# Patient Record
Sex: Male | Born: 1965
Health system: Southern US, Community
[De-identification: ages and names within clinical notes are randomized; demographics above are authoritative.]

## PROBLEM LIST (undated history)

## (undated) DIAGNOSIS — I10 Essential (primary) hypertension: Secondary | ICD-10-CM

## (undated) DIAGNOSIS — M5136 Other intervertebral disc degeneration, lumbar region: Secondary | ICD-10-CM

## (undated) DIAGNOSIS — B009 Herpesviral infection, unspecified: Secondary | ICD-10-CM

## (undated) DIAGNOSIS — M51369 Other intervertebral disc degeneration, lumbar region without mention of lumbar back pain or lower extremity pain: Secondary | ICD-10-CM

## (undated) HISTORY — PX: VASECTOMY: SHX75

## (undated) HISTORY — DX: Herpesviral infection, unspecified: B00.9

## (undated) HISTORY — DX: Other intervertebral disc degeneration, lumbar region: M51.36

## (undated) HISTORY — PX: NECK SURGERY: SHX720

## (undated) HISTORY — DX: Essential (primary) hypertension: I10

## (undated) HISTORY — DX: Other intervertebral disc degeneration, lumbar region without mention of lumbar back pain or lower extremity pain: M51.369

---

## 2012-01-14 ENCOUNTER — Ambulatory Visit (INDEPENDENT_AMBULATORY_CARE_PROVIDER_SITE_OTHER): Payer: BC Managed Care – PPO | Admitting: Family Medicine

## 2012-01-14 ENCOUNTER — Ambulatory Visit: Payer: BC Managed Care – PPO

## 2012-01-14 VITALS — BP 126/74 | HR 87 | Temp 99.2°F | Resp 17 | Ht 71.5 in | Wt 187.0 lb

## 2012-01-14 DIAGNOSIS — J209 Acute bronchitis, unspecified: Secondary | ICD-10-CM

## 2012-01-14 DIAGNOSIS — R0989 Other specified symptoms and signs involving the circulatory and respiratory systems: Secondary | ICD-10-CM

## 2012-01-14 DIAGNOSIS — R05 Cough: Secondary | ICD-10-CM

## 2012-01-14 DIAGNOSIS — R059 Cough, unspecified: Secondary | ICD-10-CM

## 2012-01-14 MED ORDER — ALBUTEROL SULFATE HFA 108 (90 BASE) MCG/ACT IN AERS: 2.0000 | INHALATION_SPRAY | Freq: Four times a day (QID) | RESPIRATORY_TRACT | Status: DC | PRN

## 2012-01-14 MED ORDER — BENZONATATE 100 MG PO CAPS: 200.0000 mg | ORAL_CAPSULE | Freq: Two times a day (BID) | ORAL | Status: AC | PRN

## 2012-01-14 MED ORDER — AZITHROMYCIN 250 MG PO TABS: ORAL_TABLET | ORAL | Status: DC

## 2012-01-14 MED ORDER — HYDROCODONE-HOMATROPINE 5-1.5 MG/5ML PO SYRP: 5.0000 mL | ORAL_SOLUTION | Freq: Every evening | ORAL | Status: DC | PRN

## 2012-01-14 NOTE — Progress Notes (Signed)
[facility]:  Office Visit  Chief Complaint:  Chief Complaint  Patient presents with  . Cough    9 days   . Nasal Congestion    9 days   . Headache    9 days     HPI: Timothy Calderon is a 46 y.o. male who complains of  9 day history of deep  Dry coughing, fever Tmax 103, chills, msk aches and pain, took otc dayquil, nyquil and mucinex. + chest congestion, no rhinorrhea, no ear pain, + HA for cough. Just moved from [place], [place]. + smoker socially  History reviewed. No pertinent past medical history. Past Surgical History  Procedure Date  . Vasectomy    History   Social History  . Marital Status: Married    Spouse Name: N/A    Number of Children: N/A  . Years of Education: N/A   Social History Main Topics  . Smoking status: Current Some Day Smoker -- 0.1 packs/day for 24 years    Types: Cigarettes  . Smokeless tobacco: None  . Alcohol Use: 1.2 oz/week    2 Cans of beer per week  . Drug Use: No  . Sexually Active: No   Other Topics Concern  . None   Social History Narrative  . None   Family History  Problem Relation Age of Onset  . Heart disease Father    Allergies  Allergen Reactions  . Penicillins Other (See Comments)    Was told when he was a kid he was allergic   . Sulfur Hives   Prior to Admission medications   Medication Sig Start Date End Date Taking? Authorizing Provider  pseudoephedrine-guaifenesin (MUCINEX D) 60-600 MG per tablet Take 1 tablet by mouth every 12 (twelve) hours.   Yes Historical Provider, MD     ROS: The patient denies  night sweats, unintentional weight loss, chest pain, palpitations, wheezing, dyspnea on exertion, nausea, vomiting, abdominal pain, dysuria, hematuria, melena, numbness, weakness, or tingling.  All other systems have been reviewed and were otherwise negative with the exception of those mentioned in the HPI and as above.    PHYSICAL EXAM: Filed Vitals:   01/14/12 1432  BP: 126/74  Pulse:  87  Temp: 99.2 F (37.3 C)  Resp: 17   Filed Vitals:   01/14/12 1432  Height: 5' 11.5" (1.816 m)  Weight: 187 lb (84.823 kg)   Body mass index is 25.72 kg/(m^2).  General: Alert, no acute distress HEENT:  Normocephalic, atraumatic, oropharynx patent. Tm nl, no sinus tenderness,  No exudates, + erythematous throat Cardiovascular:  Regular rate and rhythm, no rubs murmurs or gallops.  No Carotid bruits, radial pulse intact. No pedal edema.  Respiratory: Clear to auscultation bilaterally.  No wheezes, rales, or rhonchi.  No cyanosis, no use of accessory musculature GI: No organomegaly, abdomen is soft and non-tender, positive bowel sounds.  No masses. Skin: No rashes. Neurologic: Facial musculature symmetric. Psychiatric: Patient is appropriate throughout our interaction. Lymphatic: No cervical lymphadenopathy Musculoskeletal: Gait intact.   LABS: No results found for this or any previous visit.   EKG/XRAY:   Primary read interpreted by Dr. Conley Rolls at [facility]. Bronchitic changes No pneumothorax, no infiltrates   ASSESSMENT/PLAN: Encounter Diagnoses  Name Primary?  . Cough Yes  . Chest congestion   . Acute bronchitis    Most likely viral, however paitent insists on abx Rx Z pack, Tessalon Perles, Hydromet syrup, albuterol inh Asked pt to try sxs treatment first and then  take abx if no improvement F/u prn    Nandita Mathenia PHUONG, DO 01/14/2012 4:00 PM

## 2013-05-21 ENCOUNTER — Ambulatory Visit (INDEPENDENT_AMBULATORY_CARE_PROVIDER_SITE_OTHER): Payer: BC Managed Care – PPO | Admitting: Physician Assistant

## 2013-05-21 VITALS — BP 140/80 | HR 76 | Temp 98.6°F | Resp 16 | Ht 73.0 in | Wt 198.0 lb

## 2013-05-21 DIAGNOSIS — B009 Herpesviral infection, unspecified: Secondary | ICD-10-CM

## 2013-05-21 MED ORDER — VALACYCLOVIR HCL 500 MG PO TABS: 500.0000 mg | ORAL_TABLET | Freq: Two times a day (BID) | ORAL | Status: DC

## 2013-05-21 NOTE — Progress Notes (Signed)
Subjective:    Patient ID: Timothy Calderon, male    DOB: 19-Jan-1966, 48 y.o.   MRN: [id number]  HPI Primary Physician: No primary provider on file.  Chief Complaint: Medication refill   HPI: 48 y.o. male with history of genital HSV presents for medication refill of Valtrex 500 mg bid. Takes medication prn outbreaks. Was diagnosed with HSV in his late 20's, but notes becoming symptomatic around age 68. Rarely gets outbreak. Does not take suppressive therapy. Notes for the past 1 day has had a stinging sensation around the bilateral inner thighs. This is typical for him. No urinary symptoms.    Past Medical History  Diagnosis Date  . HSV (herpes simplex virus) infection      Home Meds: Prior to Admission medications   Medication Sig Start Date End Date Taking? Authorizing Provider                                Allergies:  Allergies  Allergen Reactions  . Penicillins Other (See Comments)    Was told when he was a kid he was allergic   . Sulfur Hives    History   Social History  . Marital Status: Married    Spouse Name: N/A    Number of Children: N/A  . Years of Education: N/A   Occupational History  . Not on file.   Social History Main Topics  . Smoking status: Current Some Day Smoker -- 0.10 packs/day for 24 years    Types: Cigarettes  . Smokeless tobacco: Not on file  . Alcohol Use: 1.2 oz/week    2 Cans of beer per week  . Drug Use: No  . Sexual Activity: No   Other Topics Concern  . Not on file   Social History Narrative  . No narrative on file     Review of Systems  Constitutional: Negative for fever, chills and fatigue.  Genitourinary: Negative for dysuria, urgency, frequency, penile swelling, scrotal swelling, genital sores, penile pain and testicular pain.       Objective:   Physical Exam  Physical Exam: Blood pressure 140/80, pulse 76, temperature 98.6 F (37 C), temperature source Oral, resp. rate 16, height 6\' 1"  (1.854 m), weight 198  lb (89.812 kg), SpO2 96.00%., Body mass index is 26.13 kg/(m^2). General: Well developed, well nourished, in no acute distress. Head: Normocephalic, atraumatic, eyes without discharge, sclera non-icteric, nares are without discharge. Bilateral auditory canals clear, TM's are without perforation, pearly grey and translucent with reflective cone of light bilaterally. Oral cavity moist, posterior pharynx without exudate, erythema, peritonsillar abscess, or post nasal drip. Uvula midline.   Neck: Supple. No thyromegaly. Full ROM. No lymphadenopathy. Lungs: Clear bilaterally to auscultation without wheezes, rales, or rhonchi. Breathing is unlabored. Heart: RRR with S1 S2. No murmurs, rubs, or gallops appreciated. Genitourinary: Circumcised penis. No hernias. Testes smooth and without TTP. No lesions.  Msk:  Strength and tone normal for age. Extremities/Skin: Warm and dry. No clubbing or cyanosis. No edema. No rashes or suspicious lesions. Neuro: Alert and oriented X 3. Moves all extremities spontaneously. Gait is normal. CNII-XII grossly in tact. Psych:  Responds to questions appropriately with a normal affect.        Assessment & Plan:  48 year old male history of genital HSV here for medication refill -Valtrex 500 mg 1 po bid for 5-7 days prn outbreak #30 RF 11 -Schedule CPE  Eula Listenyan Aliveah Gallant, MHS, PA-C [facility] [address] [place], [place] [address] [phone] [facility] 05/21/2013 5:08 PM

## 2014-10-24 ENCOUNTER — Ambulatory Visit (INDEPENDENT_AMBULATORY_CARE_PROVIDER_SITE_OTHER): Payer: BLUE CROSS/BLUE SHIELD | Admitting: Family Medicine

## 2014-10-24 VITALS — BP 130/100 | HR 63 | Temp 98.4°F | Resp 18 | Ht 72.0 in | Wt 191.0 lb

## 2014-10-24 DIAGNOSIS — B009 Herpesviral infection, unspecified: Secondary | ICD-10-CM

## 2014-10-24 MED ORDER — VALACYCLOVIR HCL 500 MG PO TABS: 500.0000 mg | ORAL_TABLET | Freq: Two times a day (BID) | ORAL | Status: DC

## 2014-10-24 NOTE — Progress Notes (Signed)
° °  This chart was scribed for Elvina Sidle, MD by Stann Ore, medical scribe at [facility].The patient was seen in exam room 13 and the patient's care was started at 4:14 PM.  Patient ID: Timothy Calderon MRN: [id number], DOB: 05-05-1965, 49 y.o. Date of Encounter: 10/24/2014  Primary Physician: No primary care provider on file.  Chief Complaint:  Chief Complaint  Patient presents with   Medication Refill    valtrex    HPI:  Timothy Calderon is a 49 y.o. male who presents to [facility] for medication refill on valtrex.  He last took this back on April 15th. He moved to [place] 3 years ago. He needs a refill for Valtrex because the prescription expired. He's been feeling stressed at work.   He works for [employer].   Past Medical History  Diagnosis Date   HSV (herpes simplex virus) infection    Hypertension      Home Meds: Prior to Admission medications   Medication Sig Start Date End Date Taking? Authorizing Provider  valACYclovir (VALTREX) 500 MG tablet Take 1 tablet (500 mg total) by mouth 2 (two) times daily. For 5-7 days as needed for outbreaks. 05/21/13  Yes Ryan Adria Devon, PA-C    Allergies:  Allergies  Allergen Reactions   Penicillins Other (See Comments)    Was told when he was a kid he was allergic    Sulfur Hives    Social History   Social History   Marital Status: Married    Spouse Name: N/A   Number of Children: N/A   Years of Education: N/A   Occupational History   Not on file.   Social History Main Topics   Smoking status: Current Some Day Smoker -- 0.10 packs/day for 24 years    Types: Cigarettes   Smokeless tobacco: Not on file   Alcohol Use: 1.2 oz/week    2 Cans of beer per week   Drug Use: No   Sexual Activity: No   Other Topics Concern   Not on file   Social History Narrative     Review of Systems: Constitutional: negative for chills, fever, night sweats, weight changes, or  fatigue  HEENT: negative for vision changes, hearing loss, congestion, rhinorrhea, ST, epistaxis, or sinus pressure Cardiovascular: negative for chest pain or palpitations Respiratory: negative for hemoptysis, wheezing, shortness of breath, or cough Abdominal: negative for abdominal pain, nausea, vomiting, diarrhea, or constipation Dermatological: negative for rash Neurologic: negative for headache, dizziness, or syncope All other systems reviewed and are otherwise negative with the exception to those above and in the HPI.  No Physical exam done. Everything discussed in room.     ASSESSMENT AND PLAN:  49 y.o. year old male with  This chart was scribed in my presence and reviewed by me personally.    ICD-9-CM ICD-10-CM   1. HSV (herpes simplex virus) infection 054.9 B00.9 valACYclovir (VALTREX) 500 MG tablet   Patient told he did not need to come back for refill of this medicine, that we would refill it because it has no side effects  Signed, Elvina Sidle, MD    Signed, Elvina Sidle, MD 10/24/2014 4:14 PM

## 2015-06-22 DIAGNOSIS — J069 Acute upper respiratory infection, unspecified: Secondary | ICD-10-CM | POA: Diagnosis not present

## 2015-06-22 DIAGNOSIS — R52 Pain, unspecified: Secondary | ICD-10-CM | POA: Diagnosis not present

## 2015-06-22 DIAGNOSIS — I10 Essential (primary) hypertension: Secondary | ICD-10-CM | POA: Diagnosis not present

## 2015-06-22 DIAGNOSIS — R972 Elevated prostate specific antigen [PSA]: Secondary | ICD-10-CM | POA: Diagnosis not present

## 2015-07-31 DIAGNOSIS — H6123 Impacted cerumen, bilateral: Secondary | ICD-10-CM | POA: Diagnosis not present

## 2015-07-31 DIAGNOSIS — H9191 Unspecified hearing loss, right ear: Secondary | ICD-10-CM | POA: Diagnosis not present

## 2015-11-13 DIAGNOSIS — M62838 Other muscle spasm: Secondary | ICD-10-CM | POA: Diagnosis not present

## 2015-11-13 DIAGNOSIS — M5410 Radiculopathy, site unspecified: Secondary | ICD-10-CM | POA: Diagnosis not present

## 2015-11-16 ENCOUNTER — Ambulatory Visit (INDEPENDENT_AMBULATORY_CARE_PROVIDER_SITE_OTHER): Payer: BLUE CROSS/BLUE SHIELD

## 2015-11-16 ENCOUNTER — Other Ambulatory Visit: Payer: Self-pay | Admitting: Unknown Physician Specialty

## 2015-11-16 DIAGNOSIS — M549 Dorsalgia, unspecified: Secondary | ICD-10-CM

## 2015-11-16 DIAGNOSIS — M50323 Other cervical disc degeneration at C6-C7 level: Secondary | ICD-10-CM | POA: Diagnosis not present

## 2015-11-16 DIAGNOSIS — M5136 Other intervertebral disc degeneration, lumbar region: Secondary | ICD-10-CM | POA: Diagnosis not present

## 2015-11-20 DIAGNOSIS — M542 Cervicalgia: Secondary | ICD-10-CM | POA: Diagnosis not present

## 2015-11-20 DIAGNOSIS — M5412 Radiculopathy, cervical region: Secondary | ICD-10-CM | POA: Diagnosis not present

## 2015-11-20 DIAGNOSIS — M47812 Spondylosis without myelopathy or radiculopathy, cervical region: Secondary | ICD-10-CM | POA: Diagnosis not present

## 2015-11-22 DIAGNOSIS — M9902 Segmental and somatic dysfunction of thoracic region: Secondary | ICD-10-CM | POA: Diagnosis not present

## 2015-11-22 DIAGNOSIS — M791 Myalgia: Secondary | ICD-10-CM | POA: Diagnosis not present

## 2015-11-22 DIAGNOSIS — M5413 Radiculopathy, cervicothoracic region: Secondary | ICD-10-CM | POA: Diagnosis not present

## 2015-11-22 DIAGNOSIS — M9901 Segmental and somatic dysfunction of cervical region: Secondary | ICD-10-CM | POA: Diagnosis not present

## 2015-11-23 DIAGNOSIS — M9901 Segmental and somatic dysfunction of cervical region: Secondary | ICD-10-CM | POA: Diagnosis not present

## 2015-11-23 DIAGNOSIS — M5413 Radiculopathy, cervicothoracic region: Secondary | ICD-10-CM | POA: Diagnosis not present

## 2015-11-23 DIAGNOSIS — M791 Myalgia: Secondary | ICD-10-CM | POA: Diagnosis not present

## 2015-11-23 DIAGNOSIS — M9902 Segmental and somatic dysfunction of thoracic region: Secondary | ICD-10-CM | POA: Diagnosis not present

## 2015-11-27 DIAGNOSIS — M5413 Radiculopathy, cervicothoracic region: Secondary | ICD-10-CM | POA: Diagnosis not present

## 2015-11-27 DIAGNOSIS — M791 Myalgia: Secondary | ICD-10-CM | POA: Diagnosis not present

## 2015-11-27 DIAGNOSIS — M9901 Segmental and somatic dysfunction of cervical region: Secondary | ICD-10-CM | POA: Diagnosis not present

## 2015-11-27 DIAGNOSIS — M9902 Segmental and somatic dysfunction of thoracic region: Secondary | ICD-10-CM | POA: Diagnosis not present

## 2015-11-28 DIAGNOSIS — M542 Cervicalgia: Secondary | ICD-10-CM | POA: Diagnosis not present

## 2015-11-28 DIAGNOSIS — M47812 Spondylosis without myelopathy or radiculopathy, cervical region: Secondary | ICD-10-CM | POA: Diagnosis not present

## 2015-11-28 DIAGNOSIS — M5412 Radiculopathy, cervical region: Secondary | ICD-10-CM | POA: Diagnosis not present

## 2015-11-28 DIAGNOSIS — R6889 Other general symptoms and signs: Secondary | ICD-10-CM | POA: Diagnosis not present

## 2015-12-15 DIAGNOSIS — M542 Cervicalgia: Secondary | ICD-10-CM | POA: Diagnosis not present

## 2015-12-15 DIAGNOSIS — M47812 Spondylosis without myelopathy or radiculopathy, cervical region: Secondary | ICD-10-CM | POA: Diagnosis not present

## 2015-12-15 DIAGNOSIS — M4802 Spinal stenosis, cervical region: Secondary | ICD-10-CM | POA: Diagnosis not present

## 2015-12-15 DIAGNOSIS — G709 Myoneural disorder, unspecified: Secondary | ICD-10-CM | POA: Diagnosis not present

## 2015-12-15 DIAGNOSIS — M5030 Other cervical disc degeneration, unspecified cervical region: Secondary | ICD-10-CM | POA: Diagnosis not present

## 2015-12-18 DIAGNOSIS — M5412 Radiculopathy, cervical region: Secondary | ICD-10-CM | POA: Diagnosis not present

## 2015-12-18 DIAGNOSIS — M47812 Spondylosis without myelopathy or radiculopathy, cervical region: Secondary | ICD-10-CM | POA: Diagnosis not present

## 2015-12-18 DIAGNOSIS — M542 Cervicalgia: Secondary | ICD-10-CM | POA: Diagnosis not present

## 2015-12-20 DIAGNOSIS — M542 Cervicalgia: Secondary | ICD-10-CM | POA: Diagnosis not present

## 2015-12-20 DIAGNOSIS — M5412 Radiculopathy, cervical region: Secondary | ICD-10-CM | POA: Diagnosis not present

## 2015-12-28 DIAGNOSIS — M542 Cervicalgia: Secondary | ICD-10-CM | POA: Diagnosis not present

## 2015-12-28 DIAGNOSIS — M47812 Spondylosis without myelopathy or radiculopathy, cervical region: Secondary | ICD-10-CM | POA: Diagnosis not present

## 2015-12-28 DIAGNOSIS — M5020 Other cervical disc displacement, unspecified cervical region: Secondary | ICD-10-CM | POA: Diagnosis not present

## 2015-12-30 DIAGNOSIS — M4312 Spondylolisthesis, cervical region: Secondary | ICD-10-CM | POA: Diagnosis not present

## 2015-12-30 DIAGNOSIS — M47812 Spondylosis without myelopathy or radiculopathy, cervical region: Secondary | ICD-10-CM | POA: Diagnosis not present

## 2015-12-30 DIAGNOSIS — Z791 Long term (current) use of non-steroidal anti-inflammatories (NSAID): Secondary | ICD-10-CM | POA: Diagnosis not present

## 2015-12-30 DIAGNOSIS — M5020 Other cervical disc displacement, unspecified cervical region: Secondary | ICD-10-CM | POA: Insufficient documentation

## 2015-12-30 DIAGNOSIS — Z0189 Encounter for other specified special examinations: Secondary | ICD-10-CM | POA: Diagnosis not present

## 2015-12-30 DIAGNOSIS — F17200 Nicotine dependence, unspecified, uncomplicated: Secondary | ICD-10-CM | POA: Diagnosis not present

## 2015-12-30 DIAGNOSIS — Z880 Allergy status to penicillin: Secondary | ICD-10-CM | POA: Diagnosis not present

## 2015-12-30 DIAGNOSIS — M5023 Other cervical disc displacement, cervicothoracic region: Secondary | ICD-10-CM | POA: Diagnosis not present

## 2015-12-30 DIAGNOSIS — Z981 Arthrodesis status: Secondary | ICD-10-CM | POA: Diagnosis not present

## 2015-12-30 DIAGNOSIS — Z882 Allergy status to sulfonamides status: Secondary | ICD-10-CM | POA: Diagnosis not present

## 2015-12-30 DIAGNOSIS — I10 Essential (primary) hypertension: Secondary | ICD-10-CM | POA: Diagnosis not present

## 2015-12-30 DIAGNOSIS — Z79899 Other long term (current) drug therapy: Secondary | ICD-10-CM | POA: Diagnosis not present

## 2015-12-30 DIAGNOSIS — M4722 Other spondylosis with radiculopathy, cervical region: Secondary | ICD-10-CM | POA: Diagnosis not present

## 2015-12-30 DIAGNOSIS — M5127 Other intervertebral disc displacement, lumbosacral region: Secondary | ICD-10-CM | POA: Diagnosis not present

## 2015-12-31 DIAGNOSIS — I10 Essential (primary) hypertension: Secondary | ICD-10-CM | POA: Diagnosis not present

## 2015-12-31 DIAGNOSIS — M5023 Other cervical disc displacement, cervicothoracic region: Secondary | ICD-10-CM | POA: Diagnosis not present

## 2015-12-31 DIAGNOSIS — Z882 Allergy status to sulfonamides status: Secondary | ICD-10-CM | POA: Diagnosis not present

## 2015-12-31 DIAGNOSIS — Z791 Long term (current) use of non-steroidal anti-inflammatories (NSAID): Secondary | ICD-10-CM | POA: Diagnosis not present

## 2015-12-31 DIAGNOSIS — M4722 Other spondylosis with radiculopathy, cervical region: Secondary | ICD-10-CM | POA: Diagnosis not present

## 2015-12-31 DIAGNOSIS — Z79899 Other long term (current) drug therapy: Secondary | ICD-10-CM | POA: Diagnosis not present

## 2015-12-31 DIAGNOSIS — F17200 Nicotine dependence, unspecified, uncomplicated: Secondary | ICD-10-CM | POA: Diagnosis not present

## 2015-12-31 DIAGNOSIS — Z880 Allergy status to penicillin: Secondary | ICD-10-CM | POA: Diagnosis not present

## 2016-01-12 DIAGNOSIS — M542 Cervicalgia: Secondary | ICD-10-CM | POA: Diagnosis not present

## 2016-01-13 ENCOUNTER — Other Ambulatory Visit: Payer: Self-pay | Admitting: Family Medicine

## 2016-01-13 DIAGNOSIS — B009 Herpesviral infection, unspecified: Secondary | ICD-10-CM

## 2016-01-14 NOTE — Telephone Encounter (Signed)
Last seen 10/2014 needs ov

## 2016-01-24 DIAGNOSIS — I10 Essential (primary) hypertension: Secondary | ICD-10-CM | POA: Diagnosis not present

## 2016-01-24 DIAGNOSIS — Z0000 Encounter for general adult medical examination without abnormal findings: Secondary | ICD-10-CM | POA: Diagnosis not present

## 2016-01-24 DIAGNOSIS — M5136 Other intervertebral disc degeneration, lumbar region: Secondary | ICD-10-CM | POA: Diagnosis not present

## 2016-01-24 DIAGNOSIS — R972 Elevated prostate specific antigen [PSA]: Secondary | ICD-10-CM | POA: Diagnosis not present

## 2016-03-11 ENCOUNTER — Encounter: Payer: Self-pay | Admitting: *Deleted

## 2016-03-11 ENCOUNTER — Emergency Department
Admission: EM | Admit: 2016-03-11 | Discharge: 2016-03-11 | Disposition: A | Discharge status: Home / Self Care | Payer: BLUE CROSS/BLUE SHIELD | Source: Home / Self Care | Attending: Family Medicine | Admitting: Family Medicine

## 2016-03-11 ENCOUNTER — Emergency Department (INDEPENDENT_AMBULATORY_CARE_PROVIDER_SITE_OTHER): Payer: BLUE CROSS/BLUE SHIELD

## 2016-03-11 DIAGNOSIS — R69 Illness, unspecified: Principal | ICD-10-CM

## 2016-03-11 DIAGNOSIS — J189 Pneumonia, unspecified organism: Secondary | ICD-10-CM | POA: Diagnosis not present

## 2016-03-11 DIAGNOSIS — R918 Other nonspecific abnormal finding of lung field: Secondary | ICD-10-CM | POA: Diagnosis not present

## 2016-03-11 DIAGNOSIS — J181 Lobar pneumonia, unspecified organism: Secondary | ICD-10-CM

## 2016-03-11 DIAGNOSIS — J111 Influenza due to unidentified influenza virus with other respiratory manifestations: Secondary | ICD-10-CM

## 2016-03-11 LAB — POCT CBC W AUTO DIFF (K'VILLE URGENT CARE)

## 2016-03-11 MED ORDER — OSELTAMIVIR PHOSPHATE 75 MG PO CAPS: 75.0000 mg | ORAL_CAPSULE | Freq: Two times a day (BID) | ORAL | 0 refills | Status: DC

## 2016-03-11 MED ORDER — ALBUTEROL SULFATE HFA 108 (90 BASE) MCG/ACT IN AERS: 2.0000 | INHALATION_SPRAY | RESPIRATORY_TRACT | 0 refills | Status: DC | PRN

## 2016-03-11 MED ORDER — IPRATROPIUM-ALBUTEROL 0.5-2.5 (3) MG/3ML IN SOLN
3.0000 mL | Freq: Once | RESPIRATORY_TRACT | Status: AC
  Administered 2016-03-11: 3 mL via RESPIRATORY_TRACT

## 2016-03-11 MED ORDER — GUAIFENESIN-CODEINE 100-10 MG/5ML PO SOLN: ORAL | 0 refills | Status: DC

## 2016-03-11 MED ORDER — LEVOFLOXACIN 750 MG PO TABS: ORAL_TABLET | ORAL | 0 refills | Status: DC

## 2016-03-11 NOTE — ED Provider Notes (Signed)
[facility]    CSN: [id number] Arrival date & time: 03/11/16  1542     History   Chief Complaint Chief Complaint  Patient presents with  . Cough    HPI Timothy Calderon is a 51 y.o. male.   Patient developed URI symptoms about one week ago and started to improve.  Last night he developed fever to 99+ and he suddenly felt worse today with myalgias, chills, fatigue, wheezing, and shortness of breath with activity.   The history is provided by the patient.    Past Medical History:  Diagnosis Date  . HSV (herpes simplex virus) infection   . Hypertension     There are no active problems to display for this patient.   Past Surgical History:  Procedure Laterality Date  . NECK SURGERY    . VASECTOMY         Home Medications    Prior to Admission medications   Medication Sig Start Date End Date Taking? Authorizing Provider  lisinopril (PRINIVIL,ZESTRIL) 10 MG tablet Take 10 mg by mouth daily.   Yes Historical Provider, MD  albuterol (PROVENTIL HFA;VENTOLIN HFA) 108 (90 Base) MCG/ACT inhaler Inhale 2 puffs into the lungs every 4 (four) hours as needed for wheezing or shortness of breath. 03/11/16   Lattie Haw, MD  guaiFENesin-codeine 100-10 MG/5ML syrup Take 10mL by mouth at bedtime as needed for cough 03/11/16   Lattie Haw, MD  levofloxacin (LEVAQUIN) 750 MG tablet Take one tab by mouth daily for 5 days. 03/11/16   Lattie Haw, MD  oseltamivir (TAMIFLU) 75 MG capsule Take 1 capsule (75 mg total) by mouth every 12 (twelve) hours. 03/11/16   Lattie Haw, MD    Family History Family History  Problem Relation Age of Onset  . Heart disease Father   . Cancer Father     Bladder  . Cancer Mother     pancreatic    Social History Social History  Substance Use Topics  . Smoking status: Current Some Day Smoker    Packs/day: 0.10    Years: 24.00    Types: Cigarettes  . Smokeless tobacco: Never Used  . Alcohol use 1.2 oz/week    2 Cans of  beer per week     Allergies   Penicillins and Sulfur   Review of Systems Review of Systems + sore throat, resolved + cough No pleuritic pain + wheezing + nasal congestion + post-nasal drainage No sinus pain/pressure No itchy/red eyes No earache No hemoptysis + SOB + fever, + chills No nausea No vomiting No abdominal pain No diarrhea No urinary symptoms No skin rash + fatigue + myalgias No headache Used OTC meds without relief   Physical Exam Triage Vital Signs ED Triage Vitals  Enc Vitals Group     BP 03/11/16 1723 168/96     Pulse Rate 03/11/16 1723 92     Resp 03/11/16 1723 18     Temp 03/11/16 1723 99.2 F (37.3 C)     Temp Source 03/11/16 1723 Oral     SpO2 03/11/16 1723 95 %     Weight 03/11/16 1723 203 lb (92.1 kg)     Height 03/11/16 1723 6\' 1"  (1.854 m)     Head Circumference --      Peak Flow --      Pain Score 03/11/16 1726 0     Pain Loc --      Pain Edu? --  Excl. in GC? --    No data found.   Updated Vital Signs BP 168/96 (BP Location: Left Arm)   Pulse 92   Temp 99.2 F (37.3 C) (Oral)   Resp 18   Ht 6\' 1"  (1.854 m)   Wt 203 lb (92.1 kg)   SpO2 95%   BMI 26.78 kg/m   Visual Acuity Right Eye Distance:   Left Eye Distance:   Bilateral Distance:    Right Eye Near:   Left Eye Near:    Bilateral Near:     Physical Exam Nursing notes and Vital Signs reviewed. Appearance:  Patient appears stated age, and in no acute distress Eyes:  Pupils are equal, round, and reactive to light and accomodation.  Extraocular movement is intact.  Conjunctivae are not inflamed  Ears:  Canals normal.  Tympanic membranes normal.  Nose:  Mildly congested turbinates.  No sinus tenderness.    Pharynx:  Normal Neck:  Supple.  Tender enlarged posterior/lateral nodes are palpated bilaterally  Lungs:  Bilateral expiratory wheezes/rhonchi with faint rales.  Breath sounds are equal.  Moving air well. Heart:  Regular rate and rhythm without murmurs,  rubs, or gallops.  Abdomen:  Nontender without masses or hepatosplenomegaly.  Bowel sounds are present.  No CVA or flank tenderness.  Extremities:  No edema.  Skin:  No rash present.    UC Treatments / Results  Labs (all labs ordered are listed, but only abnormal results are displayed) Labs Reviewed  POCT CBC W AUTO DIFF ([facility]):  WBC 11.5; LY 15.3; MO 9.2; GR 75.5; Hgb 16.1; Platelets 186     EKG  EKG Interpretation None       Radiology Dg Chest 2 View  Result Date: 03/11/2016 CLINICAL DATA:  Full like symptoms yesterday EXAM: CHEST  2 VIEW COMPARISON:  01/14/2012 FINDINGS: Borderline cardiomegaly. Linear opacities at both lateral lung bases are nonspecific. No consolidation or mass. No pneumothorax or pleural effusions. IMPRESSION: Linear opacities at the lung bases are either minimal subsegmental atelectasis or possibly bibasilar interstitial infiltrates. Correlate clinically. Electronically Signed   By: Jolaine Click M.D.   On: 03/11/2016 18:18    Procedures Procedures (including critical care time)  Medications Ordered in UC Medications  ipratropium-albuterol (DUONEB) 0.5-2.5 (3) MG/3ML nebulizer solution 3 mL (3 mLs Nebulization Given 03/11/16 1820)     Initial Impression / Assessment and Plan / UC Course  I have reviewed the triage vital signs and the nursing notes.  Pertinent labs & imaging results that were available during my care of the patient were reviewed by me and considered in my medical decision making (see chart for details).    Administered DuoNeb by hand held nebulizer  Begin Levaquin 750mg  daily for 5 days. Begin Tamiflu. Rx for Robitussin AC for night time cough.  Rx for albuterol inhaler. Take plain guaifenesin (1200mg  extended release tabs such as Mucinex) twice daily, with plenty of water, for cough and congestion. Get adequate rest.   For sinus congestion may use Afrin nasal spray (or generic oxymetazoline) twice daily for about 5  days and then discontinue.  Also recommend using saline nasal spray several times daily and saline nasal irrigation (AYR is a common brand).   Try warm salt water gargles for sore throat.  Stop all antihistamines for now, and other non-prescription cough/cold preparations. May take Ibuprofen 200mg , 4 tabs every 8 hours with food for fever, body aches, etc. Followup with Family Doctor if not improved in one week.  Final Clinical Impressions(s) / UC Diagnoses   Final diagnoses:  Influenza-like illness  Pneumonia of both lower lobes due to infectious organism    New Prescriptions New Prescriptions   ALBUTEROL (PROVENTIL HFA;VENTOLIN HFA) 108 (90 BASE) MCG/ACT INHALER    Inhale 2 puffs into the lungs every 4 (four) hours as needed for wheezing or shortness of breath.   GUAIFENESIN-CODEINE 100-10 MG/5ML SYRUP    Take 10mL by mouth at bedtime as needed for cough   LEVOFLOXACIN (LEVAQUIN) 750 MG TABLET    Take one tab by mouth daily for 5 days.   OSELTAMIVIR (TAMIFLU) 75 MG CAPSULE    Take 1 capsule (75 mg total) by mouth every 12 (twelve) hours.     Lattie HawStephen A Beese, MD 03/18/16 [phone]

## 2016-03-11 NOTE — Discharge Instructions (Signed)
Take plain guaifenesin (1200mg  extended release tabs such as Mucinex) twice daily, with plenty of water, for cough and congestion. Get adequate rest.   For sinus congestion may use Afrin nasal spray (or generic oxymetazoline) twice daily for about 5 days and then discontinue.  Also recommend using saline nasal spray several times daily and saline nasal irrigation (AYR is a common brand).   Try warm salt water gargles for sore throat.  Stop all antihistamines for now, and other non-prescription cough/cold preparations. May take Ibuprofen 200mg , 4 tabs every 8 hours with food for fever, body aches, etc.

## 2016-03-11 NOTE — ED Triage Notes (Signed)
Pt c/o URI s/s x 4-5 days. He reports that he was feeling better, but the cough, chills, and body aches became worse yesterday.

## 2016-03-26 DIAGNOSIS — M542 Cervicalgia: Secondary | ICD-10-CM | POA: Diagnosis not present

## 2016-03-26 DIAGNOSIS — Z4789 Encounter for other orthopedic aftercare: Secondary | ICD-10-CM | POA: Diagnosis not present

## 2016-08-05 DIAGNOSIS — M5136 Other intervertebral disc degeneration, lumbar region: Secondary | ICD-10-CM | POA: Diagnosis not present

## 2016-08-05 DIAGNOSIS — I10 Essential (primary) hypertension: Secondary | ICD-10-CM | POA: Diagnosis not present

## 2016-08-05 DIAGNOSIS — R635 Abnormal weight gain: Secondary | ICD-10-CM | POA: Diagnosis not present

## 2016-08-05 DIAGNOSIS — R972 Elevated prostate specific antigen [PSA]: Secondary | ICD-10-CM | POA: Diagnosis not present

## 2017-02-18 DIAGNOSIS — M25511 Pain in right shoulder: Secondary | ICD-10-CM | POA: Diagnosis not present

## 2017-07-24 DIAGNOSIS — M5136 Other intervertebral disc degeneration, lumbar region: Secondary | ICD-10-CM | POA: Diagnosis not present

## 2017-07-24 DIAGNOSIS — R9721 Rising PSA following treatment for malignant neoplasm of prostate: Secondary | ICD-10-CM | POA: Diagnosis not present

## 2017-07-24 DIAGNOSIS — I10 Essential (primary) hypertension: Secondary | ICD-10-CM | POA: Diagnosis not present

## 2017-07-24 DIAGNOSIS — Z0000 Encounter for general adult medical examination without abnormal findings: Secondary | ICD-10-CM | POA: Diagnosis not present

## 2017-07-24 DIAGNOSIS — Z8619 Personal history of other infectious and parasitic diseases: Secondary | ICD-10-CM | POA: Diagnosis not present

## 2017-10-03 DIAGNOSIS — H524 Presbyopia: Secondary | ICD-10-CM | POA: Diagnosis not present

## 2017-12-16 ENCOUNTER — Emergency Department
Admission: EM | Admit: 2017-12-16 | Discharge: 2017-12-16 | Disposition: A | Discharge status: Home / Self Care | Payer: BLUE CROSS/BLUE SHIELD | Source: Home / Self Care | Attending: Family Medicine | Admitting: Family Medicine

## 2017-12-16 ENCOUNTER — Other Ambulatory Visit: Payer: Self-pay

## 2017-12-16 DIAGNOSIS — J111 Influenza due to unidentified influenza virus with other respiratory manifestations: Secondary | ICD-10-CM

## 2017-12-16 DIAGNOSIS — R69 Illness, unspecified: Secondary | ICD-10-CM | POA: Diagnosis not present

## 2017-12-16 MED ORDER — OSELTAMIVIR PHOSPHATE 75 MG PO CAPS: 75.0000 mg | ORAL_CAPSULE | Freq: Two times a day (BID) | ORAL | 0 refills | Status: DC

## 2017-12-16 NOTE — Discharge Instructions (Addendum)
Take plain guaifenesin (1200mg  extended release tabs such as Mucinex) twice daily, with plenty of water, for cough and congestion.  May add Pseudoephedrine (30mg , one or two every 4 to 6 hours) for sinus congestion.  Get adequate rest.   May use Afrin nasal spray (or generic oxymetazoline) each morning for about 5 days and then discontinue.  Also recommend using saline nasal spray several times daily and saline nasal irrigation (AYR is a common brand).   Try warm salt water gargles for sore throat.  Stop all antihistamines for now, and other non-prescription cough/cold preparations. May take Delsym Cough Suppressant at bedtime for nighttime cough.  May take Ibuprofen 200mg , 4 tabs every 8 hours with food for chest discomfort, fever, body aches, etc.   Recommend a flu shot when well.

## 2017-12-16 NOTE — ED Provider Notes (Signed)
[facility]    CSN: [id number] Arrival date & time: 12/16/17  1456     History   Chief Complaint Chief Complaint  Patient presents with  . Generalized Body Aches  . Sore Throat  . Fatigue    HPI Timothy Calderon is a 52 y.o. male.   Complains of developing fatigue three days ago.   Two days ago he developed flu-like illness including myalgias, headache, chills, increased fatigue, and cough.  Also has mild nasal congestion and sore throat.  Cough is non-productive and somewhat worse at night.  No pleuritic pain or shortness of breath.  He has not had a flu shot this season.   The history is provided by the patient.    Past Medical History:  Diagnosis Date  . HSV (herpes simplex virus) infection   . Hypertension     There are no active problems to display for this patient.   Past Surgical History:  Procedure Laterality Date  . NECK SURGERY    . VASECTOMY         Home Medications    Prior to Admission medications   Medication Sig Start Date End Date Taking? Authorizing Provider  aspirin EC 81 MG tablet Take 81 mg by mouth daily.   Yes [provider]  albuterol (PROVENTIL HFA;VENTOLIN HFA) 108 (90 Base) MCG/ACT inhaler Inhale 2 puffs into the lungs every 4 (four) hours as needed for wheezing or shortness of breath. 03/11/16   Lattie Haw, MD  guaiFENesin-codeine 100-10 MG/5ML syrup Take 10mL by mouth at bedtime as needed for cough 03/11/16   Lattie Haw, MD  levofloxacin (LEVAQUIN) 750 MG tablet Take one tab by mouth daily for 5 days. 03/11/16   Lattie Haw, MD  lisinopril (PRINIVIL,ZESTRIL) 10 MG tablet Take 10 mg by mouth daily.    [provider]  oseltamivir (TAMIFLU) 75 MG capsule Take 1 capsule (75 mg total) by mouth every 12 (twelve) hours. 12/16/17   Lattie Haw, MD    Family History Family History  Problem Relation Age of Onset  . Heart disease Father   . Cancer Father        Bladder  . Cancer Mother       pancreatic    Social History Social History   Tobacco Use  . Smoking status: Current Some Day Smoker    Packs/day: 0.10    Years: 24.00    Pack years: 2.40    Types: Cigarettes  . Smokeless tobacco: Never Used  Substance Use Topics  . Alcohol use: Yes    Alcohol/week: 2.0 standard drinks    Types: 2 Cans of beer per week  . Drug use: No     Allergies   Penicillins and Sulfur   Review of Systems Review of Systems + sore throat + cough No pleuritic pain No wheezing + nasal congestion + post-nasal drainage No sinus pain/pressure No itchy/red eyes No earache No hemoptysis No SOB ? fever, + chills No nausea No vomiting No abdominal pain No diarrhea No urinary symptoms No skin rash + fatigue + myalgias No headache Used OTC meds without relief   Physical Exam Triage Vital Signs ED Triage Vitals  Enc Vitals Group     BP 12/16/17 1517 137/88     Pulse Rate 12/16/17 1517 82     Resp 12/16/17 1517 20     Temp 12/16/17 1517 97.9 F (36.6 C)     Temp Source 12/16/17 1517 Oral  SpO2 12/16/17 1517 96 %     Weight 12/16/17 1518 205 lb (93 kg)     Height 12/16/17 1518 6' (1.829 m)     Head Circumference --      Peak Flow --      Pain Score 12/16/17 1518 6     Pain Loc --      Pain Edu? --      Excl. in GC? --    No data found.  Updated Vital Signs BP 137/88 (BP Location: Right Arm)   Pulse 82   Temp 97.9 F (36.6 C) (Oral)   Resp 20   Ht 6' (1.829 m)   Wt 93 kg   SpO2 96%   BMI 27.80 kg/m   Visual Acuity Right Eye Distance:   Left Eye Distance:   Bilateral Distance:    Right Eye Near:   Left Eye Near:    Bilateral Near:     Physical Exam Nursing notes and Vital Signs reviewed. Appearance:  Patient appears stated age, and in no acute distress Eyes:  Pupils are equal, round, and reactive to light and accomodation.  Extraocular movement is intact.  Conjunctivae are not inflamed  Ears:  Canals normal.  Right canal partly occluded with  cerumen; unable to visualize right tympanic membrane.  Left tympanic membrane normal. Nose:  Mildly congested turbinates.  No sinus tenderness.   Pharynx:  Normal Neck:  Supple.  Enlarged posterior/lateral nodes are palpated bilaterally, tender to palpation on the left.   Lungs:  Clear to auscultation.  Breath sounds are equal.  Moving air well. Heart:  Regular rate and rhythm without murmurs, rubs, or gallops.  Abdomen:  Nontender without masses or hepatosplenomegaly.  Bowel sounds are present.  No CVA or flank tenderness.  Extremities:  No edema.  Skin:  No rash present.    UC Treatments / Results  Labs (all labs ordered are listed, but only abnormal results are displayed) Labs Reviewed - No data to display  EKG None  Radiology No results found.  Procedures Procedures (including critical care time)  Medications Ordered in UC Medications - No data to display  Initial Impression / Assessment and Plan / UC Course  I have reviewed the triage vital signs and the nursing notes.  Pertinent labs & imaging results that were available during my care of the patient were reviewed by me and considered in my medical decision making (see chart for details).    Begin Tamiflu. Followup with Family Doctor if not improved in one week.    Final Clinical Impressions(s) / UC Diagnoses   Final diagnoses:  Influenza-like illness     Discharge Instructions     Take plain guaifenesin (1200mg  extended release tabs such as Mucinex) twice daily, with plenty of water, for cough and congestion.  May add Pseudoephedrine (30mg , one or two every 4 to 6 hours) for sinus congestion.  Get adequate rest.   May use Afrin nasal spray (or generic oxymetazoline) each morning for about 5 days and then discontinue.  Also recommend using saline nasal spray several times daily and saline nasal irrigation (AYR is a common brand).   Try warm salt water gargles for sore throat.  Stop all antihistamines for now, and  other non-prescription cough/cold preparations. May take Delsym Cough Suppressant at bedtime for nighttime cough.  May take Ibuprofen 200mg , 4 tabs every 8 hours with food for chest discomfort, fever, body aches, etc.   Recommend a flu shot when well.  ED Prescriptions    Medication Sig Dispense Auth. Provider   oseltamivir (TAMIFLU) 75 MG capsule Take 1 capsule (75 mg total) by mouth every 12 (twelve) hours. 10 capsule Lattie Haw, MD         Lattie Haw, MD 12/16/17 [phone]

## 2017-12-16 NOTE — ED Triage Notes (Signed)
Pt stated that this weekend he as tired and very fatigued.  Last 24 hours, has had a sore throat, cough, chills and generalized body aches.

## 2018-01-19 DIAGNOSIS — I10 Essential (primary) hypertension: Secondary | ICD-10-CM | POA: Diagnosis not present

## 2018-01-19 DIAGNOSIS — M47812 Spondylosis without myelopathy or radiculopathy, cervical region: Secondary | ICD-10-CM | POA: Diagnosis not present

## 2018-01-19 DIAGNOSIS — M5136 Other intervertebral disc degeneration, lumbar region: Secondary | ICD-10-CM | POA: Diagnosis not present

## 2018-01-19 DIAGNOSIS — Z8619 Personal history of other infectious and parasitic diseases: Secondary | ICD-10-CM | POA: Diagnosis not present

## 2018-03-31 IMAGING — DX DG CHEST 2V
2 series · 2 of 2 positions shown · non-contrast
Comparison: 01/14/2012

CLINICAL DATA: Full like symptoms yesterday

EXAM:
CHEST  2 VIEW

[chest pa]
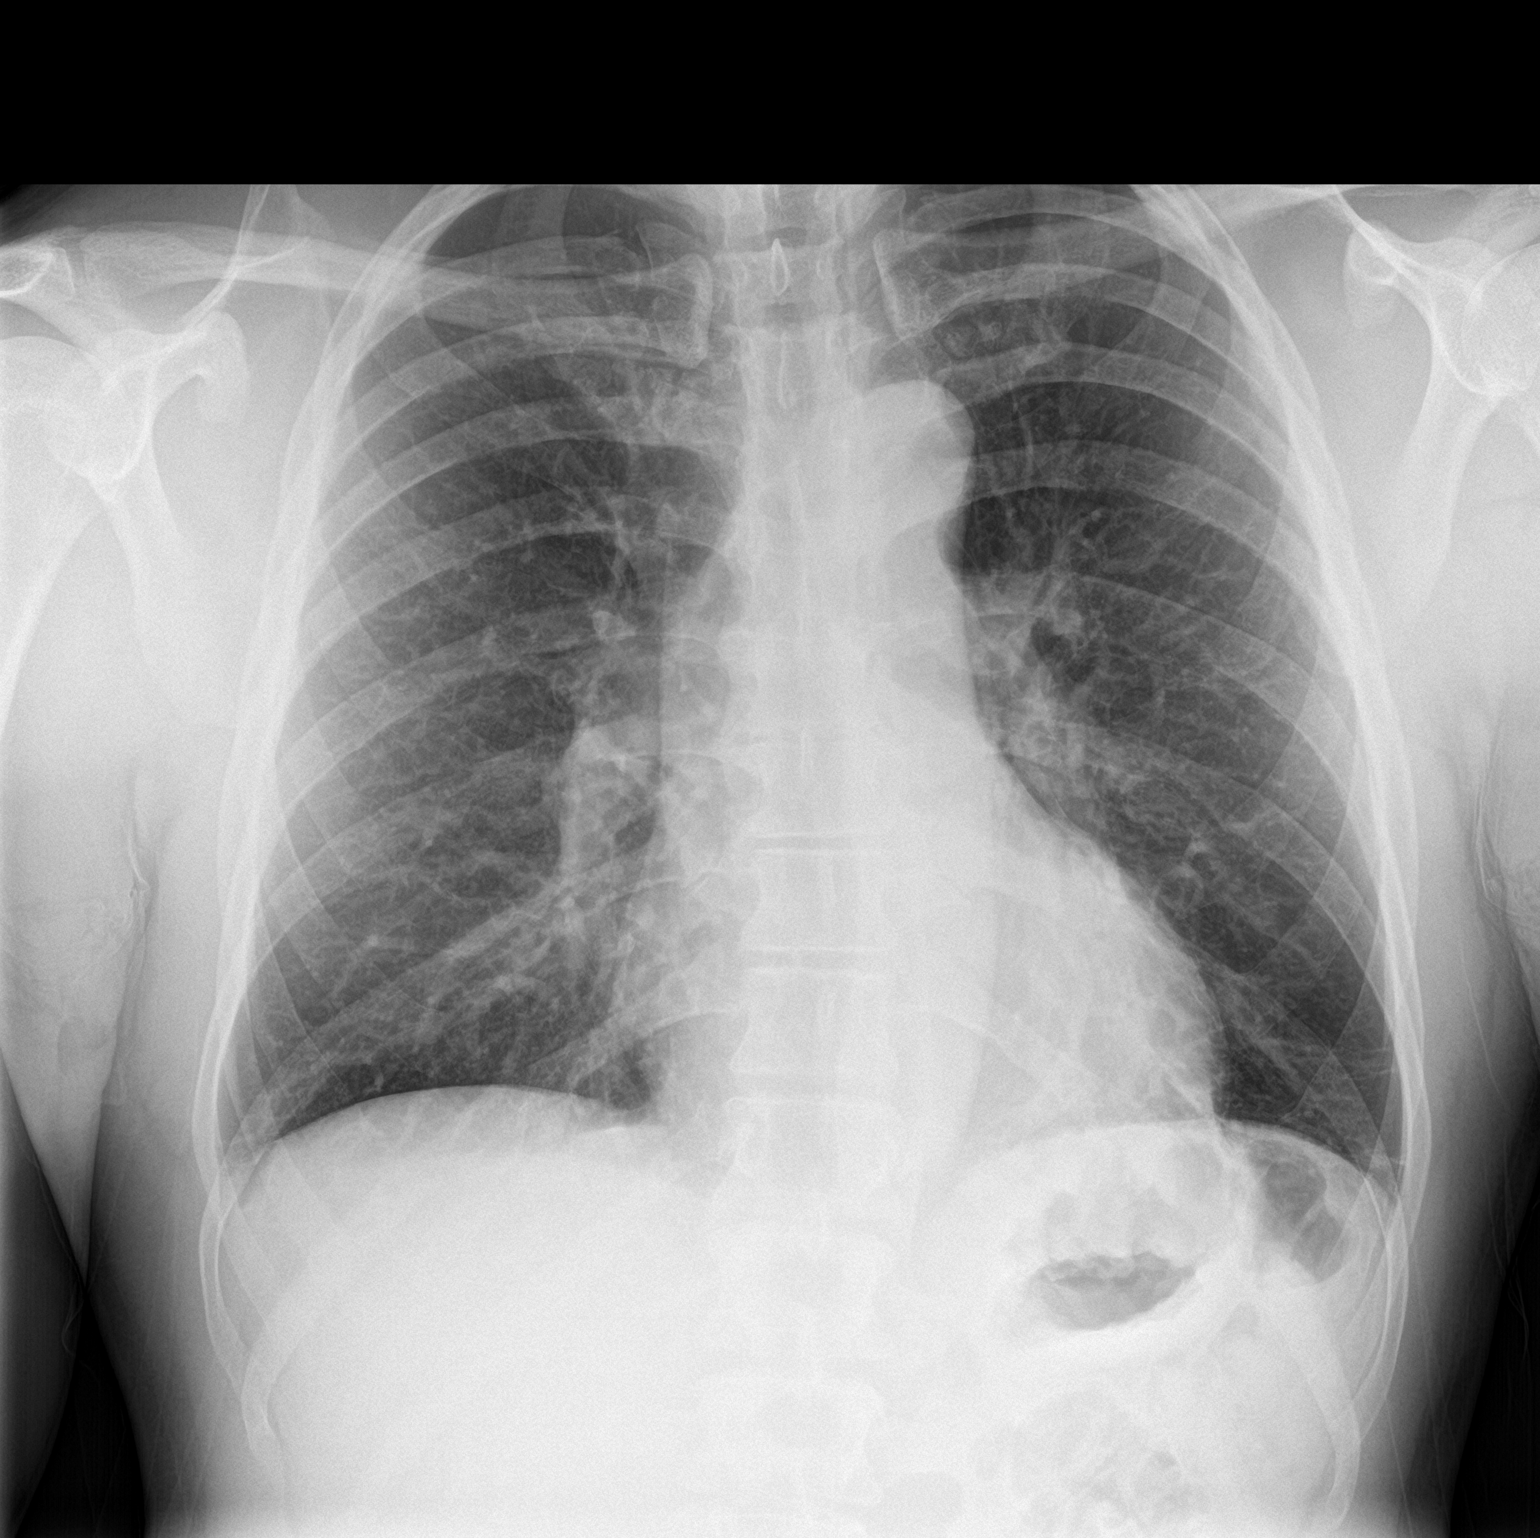

[chest lat]
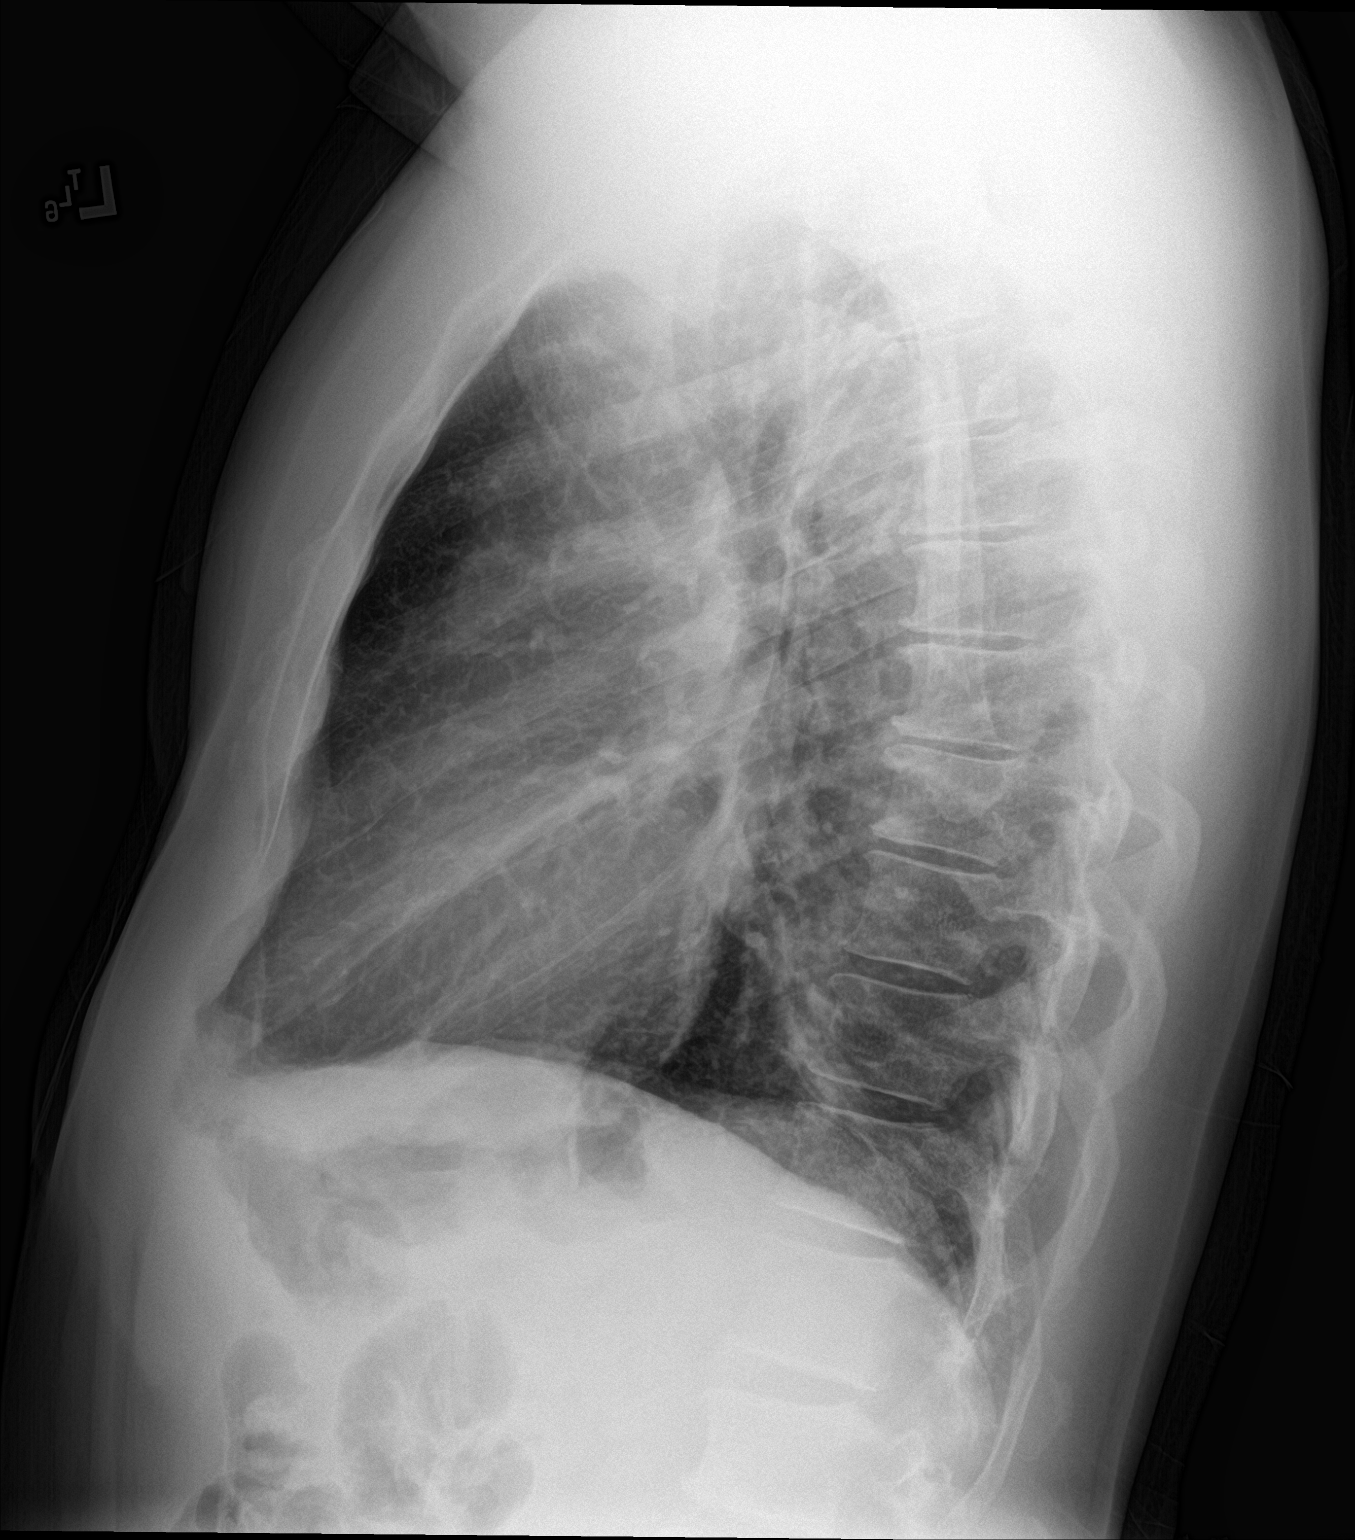

[2 of 2 positions shown; findings below may reference images not displayed]

FINDINGS: Borderline cardiomegaly. Linear opacities at both lateral lung bases
are nonspecific. No consolidation or mass. No pneumothorax or
pleural effusions.
IMPRESSION: Linear opacities at the lung bases are either minimal subsegmental
atelectasis or possibly bibasilar interstitial infiltrates.
Correlate clinically.

## 2019-01-05 DIAGNOSIS — L0390 Cellulitis, unspecified: Secondary | ICD-10-CM | POA: Diagnosis not present

## 2019-01-05 DIAGNOSIS — S90819A Abrasion, unspecified foot, initial encounter: Secondary | ICD-10-CM | POA: Diagnosis not present

## 2019-02-19 DIAGNOSIS — R5383 Other fatigue: Secondary | ICD-10-CM | POA: Diagnosis not present

## 2019-02-19 DIAGNOSIS — Z20828 Contact with and (suspected) exposure to other viral communicable diseases: Secondary | ICD-10-CM | POA: Diagnosis not present

## 2019-02-19 DIAGNOSIS — I10 Essential (primary) hypertension: Secondary | ICD-10-CM | POA: Diagnosis not present

## 2019-02-19 DIAGNOSIS — R519 Headache, unspecified: Secondary | ICD-10-CM | POA: Diagnosis not present

## 2019-06-21 ENCOUNTER — Telehealth: Payer: Self-pay | Admitting: *Deleted

## 2019-06-21 ENCOUNTER — Ambulatory Visit (INDEPENDENT_AMBULATORY_CARE_PROVIDER_SITE_OTHER): Payer: BC Managed Care – PPO | Admitting: Family Medicine

## 2019-06-21 ENCOUNTER — Other Ambulatory Visit: Payer: Self-pay

## 2019-06-21 ENCOUNTER — Encounter: Payer: Self-pay | Admitting: Family Medicine

## 2019-06-21 VITALS — BP 162/108 | HR 82 | Temp 97.8°F | Resp 18 | Ht 73.0 in | Wt 216.0 lb

## 2019-06-21 DIAGNOSIS — Z125 Encounter for screening for malignant neoplasm of prostate: Secondary | ICD-10-CM | POA: Diagnosis not present

## 2019-06-21 DIAGNOSIS — Z0001 Encounter for general adult medical examination with abnormal findings: Secondary | ICD-10-CM | POA: Diagnosis not present

## 2019-06-21 DIAGNOSIS — Z1211 Encounter for screening for malignant neoplasm of colon: Secondary | ICD-10-CM

## 2019-06-21 DIAGNOSIS — R5382 Chronic fatigue, unspecified: Secondary | ICD-10-CM | POA: Diagnosis not present

## 2019-06-21 DIAGNOSIS — Z7689 Persons encountering health services in other specified circumstances: Secondary | ICD-10-CM

## 2019-06-21 DIAGNOSIS — Z0000 Encounter for general adult medical examination without abnormal findings: Secondary | ICD-10-CM

## 2019-06-21 MED ORDER — LISINOPRIL 40 MG PO TABS: 40.0000 mg | ORAL_TABLET | Freq: Every day | ORAL | 3 refills | Status: DC

## 2019-06-21 NOTE — Telephone Encounter (Signed)
-----   Message from Ricard Dillon, [place] sent at 06/21/2019  3:35 PM EDT -----  ----- Message ----- From: Donita Brooks, MD Sent: 06/21/2019   3:31 PM EDT To: Ricard Dillon, RMA  Please schedule cologuard

## 2019-06-21 NOTE — Progress Notes (Signed)
Subjective:    Patient ID: Timothy Calderon, male    DOB: 02/01/1966, 54 y.o.   MRN: [id number]  HPI Patient is a very pleasant 54 year old white male here today to establish care.  Past medical history significant for hypertension for which he takes 10 mg of lisinopril a day.  He is also using testosterone cypionate.  He is buying this on his own and self administering it.  This was not prescribed by Dr.  Today he does have a very ruddy complexion on exam and I am concerned about polycythemia.  He does report fatigue.  He is due for a colonoscopy.  He is also due for prostate cancer screening.  Family history is concerning for heart disease in his father as well as 2 uncles.  Mother died from pancreatic cancer.  Father had bladder cancer.  Patient also smokes occasionally. Past Medical History:  Diagnosis Date  . DDD (degenerative disc disease), lumbar    L4-5  . HSV (herpes simplex virus) infection   . Hypertension    Past Surgical History:  Procedure Laterality Date  . NECK SURGERY     2 ruptured disc, 2019, ACDF-   . VASECTOMY     Current Outpatient Medications on File Prior to Visit  Medication Sig Dispense Refill  . aspirin EC 81 MG tablet Take 81 mg by mouth daily.    Marland Kitchen lisinopril (PRINIVIL,ZESTRIL) 10 MG tablet Take 10 mg by mouth daily.    . valACYclovir (VALTREX) 500 MG tablet Take 500 mg by mouth 2 (two) times daily.     No current facility-administered medications on file prior to visit.   Allergies  Allergen Reactions  . Penicillins Other (See Comments)    Was told when he was a kid he was allergic   . Sulfur Hives   Social History   Socioeconomic History  . Marital status: Significant Other    Spouse name: Not on file  . Number of children: Not on file  . Years of education: Not on file  . Highest education level: Not on file  Occupational History  . Not on file  Tobacco Use  . Smoking status: Current Some Day Smoker    Packs/day: 0.10    Years: 24.00   Pack years: 2.40    Types: Cigarettes  . Smokeless tobacco: Never Used  Substance and Sexual Activity  . Alcohol use: Yes    Alcohol/week: 2.0 standard drinks    Types: 2 Cans of beer per week  . Drug use: No  . Sexual activity: Never  Other Topics Concern  . Not on file  Social History Narrative  . Not on file   Social Determinants of Health   Financial Resource Strain:   . Difficulty of Paying Living Expenses:   Food Insecurity:   . Worried About Programme researcher, broadcasting/film/video in the Last Year:   . Barista in the Last Year:   Transportation Needs:   . Freight forwarder (Medical):   Marland Kitchen Lack of Transportation (Non-Medical):   Physical Activity:   . Days of Exercise per Week:   . Minutes of Exercise per Session:   Stress:   . Feeling of Stress :   Social Connections:   . Frequency of Communication with Friends and Family:   . Frequency of Social Gatherings with Friends and Family:   . Attends Religious Services:   . Active Member of Clubs or Organizations:   . Attends Banker Meetings:   .  Marital Status:   Intimate Partner Violence:   . Fear of Current or Ex-Partner:   . Emotionally Abused:   Marland Kitchen Physically Abused:   . Sexually Abused:    Family History  Problem Relation Age of Onset  . Cancer Father        Bladder  . Heart disease Father   . Cancer Mother        pancreatic  . Heart disease Maternal Uncle   . Heart disease Paternal Uncle       Review of Systems  All other systems reviewed and are negative.      Objective:   Physical Exam Vitals reviewed.  Constitutional:      General: He is not in acute distress.    Appearance: He is normal weight. He is not ill-appearing, toxic-appearing or diaphoretic.  HENT:     Head: Normocephalic and atraumatic.     Right Ear: Tympanic membrane, ear canal and external ear normal. There is no impacted cerumen.     Left Ear: Tympanic membrane, ear canal and external ear normal. There is no impacted  cerumen.     Nose: Nose normal. No congestion or rhinorrhea.     Mouth/Throat:     Mouth: Mucous membranes are moist.     Pharynx: No oropharyngeal exudate or posterior oropharyngeal erythema.  Eyes:     General: No scleral icterus.       Right eye: No discharge.        Left eye: No discharge.     Extraocular Movements: Extraocular movements intact.     Conjunctiva/sclera: Conjunctivae normal.     Pupils: Pupils are equal, round, and reactive to light.  Neck:     Vascular: No carotid bruit.  Cardiovascular:     Rate and Rhythm: Normal rate and regular rhythm.     Pulses: Normal pulses.     Heart sounds: Normal heart sounds. No murmur. No friction rub.  Pulmonary:     Effort: Pulmonary effort is normal. No respiratory distress.     Breath sounds: Normal breath sounds. No stridor. No wheezing, rhonchi or rales.  Chest:     Chest wall: No tenderness.  Abdominal:     General: Abdomen is flat. There is no distension.     Palpations: Abdomen is soft. There is no mass.     Tenderness: There is no abdominal tenderness. There is no right CVA tenderness, left CVA tenderness, guarding or rebound.     Hernia: No hernia is present.  Musculoskeletal:     Cervical back: Normal range of motion and neck supple. No rigidity or tenderness.     Right lower leg: No edema.     Left lower leg: No edema.  Lymphadenopathy:     Cervical: No cervical adenopathy.  Skin:    General: Skin is warm.     Coloration: Skin is not jaundiced or pale.     Findings: No bruising, erythema, lesion or rash.  Neurological:     General: No focal deficit present.     Mental Status: He is alert and oriented to person, place, and time. Mental status is at baseline.     Cranial Nerves: No cranial nerve deficit.     Sensory: No sensory deficit.     Motor: No weakness.     Coordination: Coordination normal.     Gait: Gait normal.     Deep Tendon Reflexes: Reflexes normal.  Psychiatric:        Mood and Affect:  Mood  normal.        Behavior: Behavior normal.        Thought Content: Thought content normal.        Judgment: Judgment normal.           Assessment & Plan:  Encounter to establish care with new doctor - Plan: CBC with Differential/Platelet, COMPLETE METABOLIC PANEL WITH GFR, Lipid panel, PSA  General medical exam - Plan: CBC with Differential/Platelet, COMPLETE METABOLIC PANEL WITH GFR, Lipid panel, PSA  Prostate cancer screening - Plan: PSA  Colon cancer screening  Chronic fatigue - Plan: Testosterone Total,Free,Bio, Males  Blood pressure is very high today.  I recommended increasing lisinopril to 40 mg a day and rechecking blood pressure in 2 weeks.  Come back fasting for CBC, CMP, fasting lipid panel.  Screen for prostate cancer with PSA.  We discussed colon cancer screening and I recommended Cologuard which I will schedule for him.  I will also check a testosterone level given his self administration of testosterone.  I did recommend against this due to the risk of polycythemia, prostate cancer, blood clots, and heart disease.  Also recommended smoking cessation.  Also recommended the COVID-19 vaccination.

## 2019-06-21 NOTE — Telephone Encounter (Signed)
Received verbal orders for Cologuard.   Order placed via [employer].   Cologuard (Order [id number])

## 2019-06-22 ENCOUNTER — Other Ambulatory Visit: Payer: BC Managed Care – PPO

## 2019-06-22 ENCOUNTER — Encounter: Payer: Self-pay | Admitting: Family Medicine

## 2019-06-22 DIAGNOSIS — Z125 Encounter for screening for malignant neoplasm of prostate: Secondary | ICD-10-CM | POA: Diagnosis not present

## 2019-06-22 DIAGNOSIS — Z5181 Encounter for therapeutic drug level monitoring: Secondary | ICD-10-CM | POA: Diagnosis not present

## 2019-06-22 DIAGNOSIS — R5382 Chronic fatigue, unspecified: Secondary | ICD-10-CM | POA: Diagnosis not present

## 2019-06-22 DIAGNOSIS — Z0000 Encounter for general adult medical examination without abnormal findings: Secondary | ICD-10-CM | POA: Diagnosis not present

## 2019-06-22 LAB — COMPLETE METABOLIC PANEL WITH GFR
BUN: 12 mg/dL (ref 7–25)
Glucose, Bld: 86 mg/dL (ref 65–99)

## 2019-06-23 LAB — CBC WITH DIFFERENTIAL/PLATELET
Absolute Monocytes: 477 cells/uL (ref 200–950)
Basophils Absolute: 42 cells/uL (ref 0–200)
Basophils Relative: 0.8 %
Eosinophils Absolute: 58 cells/uL (ref 15–500)
Eosinophils Relative: 1.1 %
HCT: 52.2 % — ABNORMAL HIGH (ref 38.5–50.0)
Hemoglobin: 17.5 g/dL — ABNORMAL HIGH (ref 13.2–17.1)
Lymphs Abs: 1108 cells/uL (ref 850–3900)
MCH: 32.8 pg (ref 27.0–33.0)
MCHC: 33.5 g/dL (ref 32.0–36.0)
MCV: 97.9 fL (ref 80.0–100.0)
MPV: 11.7 fL (ref 7.5–12.5)
Monocytes Relative: 9 %
Neutro Abs: 3615 cells/uL (ref 1500–7800)
Neutrophils Relative %: 68.2 %
Platelets: 168 10*3/uL (ref 140–400)
RBC: 5.33 10*6/uL (ref 4.20–5.80)
RDW: 12.4 % (ref 11.0–15.0)
Total Lymphocyte: 20.9 %
WBC: 5.3 10*3/uL (ref 3.8–10.8)

## 2019-06-23 LAB — LIPID PANEL
Cholesterol: 152 mg/dL (ref <200)
HDL: 44 mg/dL (ref >=40)
LDL Cholesterol (Calc): 94 mg/dL (calc)
Non-HDL Cholesterol (Calc): 108 mg/dL (calc) (ref <130)
Total CHOL/HDL Ratio: 3.5 (calc) (ref <5.0)
Triglycerides: 46 mg/dL (ref <150)

## 2019-06-23 LAB — COMPLETE METABOLIC PANEL WITH GFR
AG Ratio: 1.9 (calc) (ref 1.0–2.5)
ALT: 31 U/L (ref 9–46)
AST: 26 U/L (ref 10–35)
Albumin: 4.5 g/dL (ref 3.6–5.1)
Alkaline phosphatase (APISO): 69 U/L (ref 35–144)
CO2: 30 mmol/L (ref 20–32)
Calcium: 10.1 mg/dL (ref 8.6–10.3)
Chloride: 100 mmol/L (ref 98–110)
Creat: 1.11 mg/dL (ref 0.70–1.33)
GFR, Est African American: 88 mL/min/{1.73_m2} (ref >=60)
GFR, Est Non African American: 76 mL/min/{1.73_m2} (ref >=60)
Globulin: 2.4 g/dL (calc) (ref 1.9–3.7)
Potassium: 5.3 mmol/L (ref 3.5–5.3)
Sodium: 138 mmol/L (ref 135–146)
Total Bilirubin: 1.2 mg/dL (ref 0.2–1.2)
Total Protein: 6.9 g/dL (ref 6.1–8.1)

## 2019-06-23 LAB — TESTOSTERONE TOTAL,FREE,BIO, MALES
Albumin: 4.5 g/dL (ref 3.6–5.1)
Sex Hormone Binding: 33 nmol/L (ref 10–50)
Testosterone, Bioavailable: 676.7 ng/dL — ABNORMAL HIGH (ref >=110.0)
Testosterone, Free: 329 pg/mL — ABNORMAL HIGH (ref 46.0–224.0)
Testosterone: 1691 ng/dL — ABNORMAL HIGH (ref 250–827)

## 2019-06-23 LAB — PSA: PSA: 2.9 ng/mL (ref <=4.0)

## 2019-06-28 ENCOUNTER — Other Ambulatory Visit: Payer: Self-pay | Admitting: Family Medicine

## 2019-06-28 MED ORDER — HYDROCHLOROTHIAZIDE 25 MG PO TABS: 25.0000 mg | ORAL_TABLET | Freq: Every day | ORAL | 3 refills | Status: DC

## 2019-07-05 ENCOUNTER — Encounter: Payer: Self-pay | Admitting: Family Medicine

## 2019-07-22 ENCOUNTER — Ambulatory Visit: Payer: Self-pay | Attending: Internal Medicine

## 2019-07-22 DIAGNOSIS — Z23 Encounter for immunization: Secondary | ICD-10-CM

## 2019-07-22 NOTE — Progress Notes (Signed)
   Covid-19 Vaccination Clinic  Name:  Timothy Calderon    MRN: [id number] DOB: 12/05/1965  07/22/2019  Mr. [name] was observed post Covid-19 immunization for 15 minutes without incident. He was provided with Vaccine Information Sheet and instruction to access the V-Safe system.   Mr. [name] was instructed to call 911 with any severe reactions post vaccine: Marland Kitchen Difficulty breathing  . Swelling of face and throat  . A fast heartbeat  . A bad rash all over body  . Dizziness and weakness   Immunizations Administered    Name Date Dose VIS Date Route   Pfizer COVID-19 Vaccine 07/22/2019 11:44 AM 0.3 mL 04/07/2018 Intramuscular   Manufacturer: [employer], [employer]   Lot: [id number]   NDC: [address]-9

## 2019-08-12 ENCOUNTER — Ambulatory Visit: Payer: Self-pay | Attending: Internal Medicine

## 2019-08-17 ENCOUNTER — Other Ambulatory Visit: Payer: Self-pay

## 2019-08-17 ENCOUNTER — Ambulatory Visit (INDEPENDENT_AMBULATORY_CARE_PROVIDER_SITE_OTHER): Payer: BC Managed Care – PPO | Admitting: Family Medicine

## 2019-08-17 VITALS — BP 120/82 | HR 77 | Temp 96.8°F | Ht 73.0 in | Wt 209.0 lb

## 2019-08-17 DIAGNOSIS — S8011XA Contusion of right lower leg, initial encounter: Secondary | ICD-10-CM

## 2019-08-17 DIAGNOSIS — S8001XA Contusion of right knee, initial encounter: Secondary | ICD-10-CM | POA: Diagnosis not present

## 2019-08-17 MED ORDER — CEPHALEXIN 500 MG PO CAPS: 500.0000 mg | ORAL_CAPSULE | Freq: Four times a day (QID) | ORAL | 0 refills | Status: DC

## 2019-08-17 NOTE — Progress Notes (Signed)
Subjective:    Patient ID: Timothy Calderon, male    DOB: 1966-01-29, 54 y.o.   MRN: [id number]  HPI  Saturday, [place] on and off guard.  He slipped while trying to climb into the boat and suffered a contusion to right shin.  There is now a recent major 3 cm subcutaneous collection of blood on the anterior right skin.  There is also significant swelling circumferentially around the calf and anterior right skin with bruising.  Patient appears to have ruptured varicose vein and has a hematoma that has formed on the anterior right shin.  This is causing pressure and discomfort.  The skin in that area is warm to the touch and slightly erythematous although it is difficult to delineate due to the bruising.  There was a break in the skin over top of the hematoma as there is a linear 1 cm long laceration with scab.  At the present time the right foot distal right shin is neurovascularly intact with no evidence.  Pain appears to be due to to the bruising and swelling.  The more he is on his feet more he feels an aching pain in his anterior legs.  However I am concerned about secondary cellulitis developing.  After the patient suffered a contusion he was still [place] the [place] despite having a break in his skin. Past Medical History:  Diagnosis Date  . DDD (degenerative disc disease), lumbar    L4-5  . HSV (herpes simplex virus) infection   . Hypertension    Past Surgical History:  Procedure Laterality Date  . NECK SURGERY     2 ruptured disc, 2019, ACDF-   . VASECTOMY     Current Outpatient Medications on File Prior to Visit  Medication Sig Dispense Refill  . aspirin EC 81 MG tablet Take 81 mg by mouth daily.    . hydrochlorothiazide (HYDRODIURIL) 25 MG tablet Take 1 tablet (25 mg total) by mouth daily. 90 tablet 3  . lisinopril (PRINIVIL,ZESTRIL) 10 MG tablet Take 10 mg by mouth daily.    Marland Kitchen lisinopril (ZESTRIL) 40 MG tablet Take 1 tablet (40 mg  total) by mouth daily. 90 tablet 3  . valACYclovir (VALTREX) 500 MG tablet Take 500 mg by mouth 2 (two) times daily.     No current facility-administered medications on file prior to visit.   Allergies  Allergen Reactions  . Penicillins Other (See Comments)    Was told when he was a kid he was allergic   . Sulfur Hives   Social History   Socioeconomic History  . Marital status: Single    Spouse name: Not on file  . Number of children: Not on file  . Years of education: Not on file  . Highest education level: Not on file  Occupational History  . Not on file  Tobacco Use  . Smoking status: Current Some Day Smoker    Packs/day: 0.10    Years: 24.00    Pack years: 2.40    Types: Cigarettes  . Smokeless tobacco: Never Used  Substance and Sexual Activity  . Alcohol use: Yes    Alcohol/week: 2.0 standard drinks    Types: 2 Cans of beer per week  . Drug use: No  . Sexual activity: Never  Other Topics Concern  . Not on file  Social History Narrative  . Not on file   Social Determinants of Health   Financial Resource Strain:   .  Difficulty of Paying Living Expenses:   Food Insecurity:   . Worried About Programme researcher, broadcasting/film/video in the Last Year:   . Barista in the Last Year:   Transportation Needs:   . Freight forwarder (Medical):   Marland Kitchen Lack of Transportation (Non-Medical):   Physical Activity:   . Days of Exercise per Week:   . Minutes of Exercise per Session:   Stress:   . Feeling of Stress :   Social Connections:   . Frequency of Communication with Friends and Family:   . Frequency of Social Gatherings with Friends and Family:   . Attends Religious Services:   . Active Member of Clubs or Organizations:   . Attends Banker Meetings:   Marland Kitchen Marital Status:   Intimate Partner Violence:   . Fear of Current or Ex-Partner:   . Emotionally Abused:   Marland Kitchen Physically Abused:   . Sexually Abused:      Review of Systems  All other systems reviewed and  are negative.      Objective:   Physical Exam Vitals reviewed.  Constitutional:      Appearance: Normal appearance.  Cardiovascular:     Rate and Rhythm: Normal rate and regular rhythm.     Heart sounds: Normal heart sounds.  Pulmonary:     Effort: Pulmonary effort is normal.     Breath sounds: Normal breath sounds.  Musculoskeletal:       Legs:  Neurological:     Mental Status: He is alert.           Assessment & Plan:  Contusion of right knee and lower leg, initial encounter  Patient certainly has a hematoma from his contusion.  I do not feel the patient has a fracture in his tibia.  I believe the pain and discomfort is due to swelling.  Patient was feeling fine over the weekend elevated however for his leg against a concrete wall.  I recommended he take the next 48 hours elevated.  I recommended ice to be applied 3-4 times a day for 15 minutes then not directly to the skin before he felt like.  I recommended ibuprofen 600 mg every 8 hours.  I will start the patient on Keflex in case this is secondary cellulitis developing, given the warmth and erythema.  My 2 biggest concerns (1) development of compartment syndrome if the patient continues to develop worsening edema and swelling (2) a deep tissue infection/abscess due to the initial laceration and hematoma within the muscle compartment.  If leg pain worsens would recommend a CT scan of the right leg or ER evaluation depending on the severity of pain

## 2019-09-02 ENCOUNTER — Ambulatory Visit: Payer: Self-pay | Attending: Internal Medicine

## 2019-09-02 DIAGNOSIS — Z23 Encounter for immunization: Secondary | ICD-10-CM

## 2019-09-02 NOTE — Progress Notes (Signed)
   Covid-19 Vaccination Clinic  Name:  NIKHOLAS GEFFRE    MRN: [id number] DOB: December 27, 1965  09/02/2019  Mr. Mcevoy was observed post Covid-19 immunization for 15 minutes without incident. He was provided with Vaccine Information Sheet and instruction to access the V-Safe system.   Mr. Prewitt was instructed to call 911 with any severe reactions post vaccine: Marland Kitchen Difficulty breathing  . Swelling of face and throat  . A fast heartbeat  . A bad rash all over body  . Dizziness and weakness   Immunizations Administered    Name Date Dose VIS Date Route   Pfizer COVID-19 Vaccine 09/02/2019  1:53 PM 0.3 mL 04/07/2018 Intramuscular   Manufacturer: [employer], [employer]   Lot: [id number]   NDC: [address]-1

## 2020-04-04 ENCOUNTER — Other Ambulatory Visit: Payer: Self-pay | Admitting: Family Medicine

## 2020-06-13 ENCOUNTER — Telehealth: Payer: Self-pay | Admitting: Family Medicine

## 2020-06-13 MED ORDER — HYDROCHLOROTHIAZIDE 25 MG PO TABS: 25.0000 mg | ORAL_TABLET | Freq: Every day | ORAL | 3 refills | Status: DC

## 2020-06-13 NOTE — Telephone Encounter (Signed)
Patient called to follow up on three refill requests sent by pharmacy for hydrochlorothiazide (HYDRODIURIL) 25 MG tablet [[id number]]   Pharmacy:   [id number] [place], [place] - [address]  [address] [place] [address]  Phone:  [phone] Fax:  [phone]  DEA #:  [id number]  Patient going out of town tomorrow to be with his father and he needs the medication before he leaves.   Please advise asap at [phone].

## 2020-06-13 NOTE — Telephone Encounter (Signed)
Done

## 2020-06-21 ENCOUNTER — Other Ambulatory Visit: Payer: Self-pay

## 2020-06-21 MED ORDER — LISINOPRIL 40 MG PO TABS: 40.0000 mg | ORAL_TABLET | Freq: Every day | ORAL | 3 refills | Status: DC

## 2020-09-21 ENCOUNTER — Telehealth: Payer: Self-pay | Admitting: *Deleted

## 2020-09-21 MED ORDER — NIRMATRELVIR/RITONAVIR (PAXLOVID)TABLET: 3.0000 | ORAL_TABLET | Freq: Two times a day (BID) | ORAL | 0 refills | Status: AC

## 2020-09-21 NOTE — Telephone Encounter (Signed)
Patient tested positive for COVID on 09/19/2020.   Sx include cough, congestion, nausea.  Advised to continue symptom management with OTC medications: Tylenol/ Ibuprofen for fever/ body aches, Mucinex/ Delsym for cough/ chest congestion, Afrin/Sudafed/nasal saline for sinus pressure/ nasal congestion  GFR 76. Order for Paxlovid sent to pharmacy. Advised possible SE include nausea, diarrhea, loss of taste and hypertension.  If chest pain, shortness of breath, fever >104 that is unresponsive to antipyretics noted, or if patient is unable to tolerate fluids, advised to go to ER for evaluation.   Advised that the CDC recommends the following criteria prior to ending isolation in vaccinated persons: at least 5 days since symptoms onset, and then an additional 5 days of masking, AND 3 days fever free without antipyretics (Tylenol or Ibuprofen) AND improvement in respiratory symptoms.

## 2020-09-28 ENCOUNTER — Other Ambulatory Visit: Payer: Self-pay | Admitting: *Deleted

## 2020-09-28 MED ORDER — VALACYCLOVIR HCL 500 MG PO TABS: 500.0000 mg | ORAL_TABLET | Freq: Every day | ORAL | 1 refills | Status: DC

## 2020-10-26 DIAGNOSIS — M2679 Other specified alveolar anomalies: Secondary | ICD-10-CM | POA: Diagnosis not present

## 2021-04-01 ENCOUNTER — Other Ambulatory Visit: Payer: Self-pay | Admitting: Family Medicine

## 2021-04-05 ENCOUNTER — Other Ambulatory Visit: Payer: Self-pay

## 2021-04-05 MED ORDER — VALACYCLOVIR HCL 500 MG PO TABS: 500.0000 mg | ORAL_TABLET | Freq: Every day | ORAL | 0 refills | Status: DC

## 2021-04-10 ENCOUNTER — Other Ambulatory Visit: Payer: Self-pay | Admitting: Family Medicine

## 2021-04-12 ENCOUNTER — Ambulatory Visit: Payer: BC Managed Care – PPO | Admitting: Family Medicine

## 2021-05-03 ENCOUNTER — Other Ambulatory Visit: Payer: Self-pay | Admitting: Family Medicine

## 2021-05-08 ENCOUNTER — Ambulatory Visit (INDEPENDENT_AMBULATORY_CARE_PROVIDER_SITE_OTHER): Payer: Self-pay | Admitting: Family Medicine

## 2021-05-08 ENCOUNTER — Other Ambulatory Visit: Payer: Self-pay

## 2021-05-08 VITALS — BP 128/82 | HR 87 | Temp 97.2°F | Ht 73.0 in | Wt 216.4 lb

## 2021-05-08 DIAGNOSIS — T387X5D Adverse effect of androgens and anabolic congeners, subsequent encounter: Secondary | ICD-10-CM

## 2021-05-08 DIAGNOSIS — Z125 Encounter for screening for malignant neoplasm of prostate: Secondary | ICD-10-CM

## 2021-05-08 DIAGNOSIS — I10 Essential (primary) hypertension: Secondary | ICD-10-CM

## 2021-05-08 LAB — BASIC METABOLIC PANEL WITH GFR
BUN: 13 mg/dL (ref 7–25)
CO2: 32 mmol/L (ref 20–32)
Calcium: 10 mg/dL (ref 8.6–10.3)
Chloride: 91 mmol/L — ABNORMAL LOW (ref 98–110)
Creat: 1.11 mg/dL (ref 0.70–1.30)
Glucose, Bld: 72 mg/dL (ref 65–99)
Potassium: 3.9 mmol/L (ref 3.5–5.3)
Sodium: 130 mmol/L — ABNORMAL LOW (ref 135–146)
eGFR: 79 mL/min/{1.73_m2} (ref >=60)

## 2021-05-08 MED ORDER — HYDROCHLOROTHIAZIDE 25 MG PO TABS: 25.0000 mg | ORAL_TABLET | Freq: Every day | ORAL | 3 refills | Status: DC

## 2021-05-08 MED ORDER — VALACYCLOVIR HCL 500 MG PO TABS: 500.0000 mg | ORAL_TABLET | Freq: Every day | ORAL | 3 refills | Status: DC

## 2021-05-08 MED ORDER — LISINOPRIL 40 MG PO TABS: 40.0000 mg | ORAL_TABLET | Freq: Every day | ORAL | 3 refills | Status: DC

## 2021-05-08 NOTE — Progress Notes (Signed)
? ?Subjective:  ? ? Patient ID: Timothy Calderon, male    DOB: 1965-12-22, 56 y.o.   MRN: [id number] ? ?HPI patient is a very pleasant 56 year old Caucasian gentleman who is here today for hypertension.  His blood pressure is well controlled at 128/82 but he is overdue for fasting lab work.  Unfortunately he does not have insurance presently.  Therefore he would like to defer additional lab work until he acquires insurance.  He is overdue for a PSA to screen for prostate cancer as well as a colonoscopy although he is very interested in Cologuard.  He denies any chest pain shortness of breath or dyspnea on exertion.  He continues to use testosterone supplements occasionally.  Therefore he would like to check his testosterone level is at his last visit his testosterone was over 1600 and his hemoglobin was high at 17.5. ?Family history is concerning for heart disease in his father as well as 2 uncles.  Mother died from pancreatic cancer.  Father had bladder cancer.  Patient also smokes occasionally. ?Past Medical History:  ?Diagnosis Date  ? DDD (degenerative disc disease), lumbar   ? L4-5  ? HSV (herpes simplex virus) infection   ? Hypertension   ? ?Past Surgical History:  ?Procedure Laterality Date  ? NECK SURGERY    ? 2 ruptured disc, 2019, ACDF-   ? VASECTOMY    ? ?Current Outpatient Medications on File Prior to Visit  ?Medication Sig Dispense Refill  ? aspirin EC 81 MG tablet Take 81 mg by mouth daily.    ? hydrochlorothiazide (HYDRODIURIL) 25 MG tablet Take 1 tablet (25 mg total) by mouth daily. 90 tablet 3  ? lisinopril (ZESTRIL) 40 MG tablet Take 1 tablet (40 mg total) by mouth daily. 90 tablet 3  ? valACYclovir (VALTREX) 500 MG tablet Take 1 tablet (500 mg total) by mouth daily. 30 tablet 0  ? ?No current facility-administered medications on file prior to visit.  ? ?Allergies  ?Allergen Reactions  ? Elemental Sulfur Hives  ? Penicillins Other (See Comments)  ?  Was told when he was a kid he was allergic    ? ?Social History  ? ?Socioeconomic History  ? Marital status: Single  ?  Spouse name: Not on file  ? Number of children: Not on file  ? Years of education: Not on file  ? Highest education level: Not on file  ?Occupational History  ? Not on file  ?Tobacco Use  ? Smoking status: Some Days  ?  Packs/day: 0.10  ?  Years: 24.00  ?  Pack years: 2.40  ?  Types: Cigarettes  ? Smokeless tobacco: Never  ?Substance and Sexual Activity  ? Alcohol use: Yes  ?  Alcohol/week: 2.0 standard drinks  ?  Types: 2 Cans of beer per week  ? Drug use: No  ? Sexual activity: Never  ?Other Topics Concern  ? Not on file  ?Social History Narrative  ? Not on file  ? ?Social Determinants of Health  ? ?Financial Resource Strain: Not on file  ?Food Insecurity: Not on file  ?Transportation Needs: Not on file  ?Physical Activity: Not on file  ?Stress: Not on file  ?Social Connections: Not on file  ?Intimate Partner Violence: Not on file  ? ?Family History  ?Problem Relation Age of Onset  ? Cancer Father   ?     Bladder  ? Heart disease Father   ? Cancer Mother   ?     pancreatic  ?  Heart disease Maternal Uncle   ? Heart disease Paternal Uncle   ? ? ? ? ?Review of Systems  ?All other systems reviewed and are negative. ? ?   ?Objective:  ? Physical Exam ?Vitals reviewed.  ?Constitutional:   ?   General: He is not in acute distress. ?   Appearance: He is normal weight. He is not ill-appearing, toxic-appearing or diaphoretic.  ?HENT:  ?   Head: Normocephalic and atraumatic.  ?   Right Ear: Tympanic membrane, ear canal and external ear normal. There is no impacted cerumen.  ?   Left Ear: Tympanic membrane, ear canal and external ear normal. There is no impacted cerumen.  ?   Nose: Nose normal. No congestion or rhinorrhea.  ?   Mouth/Throat:  ?   Mouth: Mucous membranes are moist.  ?   Pharynx: No oropharyngeal exudate or posterior oropharyngeal erythema.  ?Eyes:  ?   General: No scleral icterus.    ?   Right eye: No discharge.     ?   Left eye: No  discharge.  ?   Extraocular Movements: Extraocular movements intact.  ?   Conjunctiva/sclera: Conjunctivae normal.  ?   Pupils: Pupils are equal, round, and reactive to light.  ?Neck:  ?   Vascular: No carotid bruit.  ?Cardiovascular:  ?   Rate and Rhythm: Normal rate and regular rhythm.  ?   Pulses: Normal pulses.  ?   Heart sounds: Normal heart sounds. No murmur heard. ?  No friction rub.  ?Pulmonary:  ?   Effort: Pulmonary effort is normal. No respiratory distress.  ?   Breath sounds: Normal breath sounds. No stridor. No wheezing, rhonchi or rales.  ?Chest:  ?   Chest wall: No tenderness.  ?Abdominal:  ?   General: Abdomen is flat. There is no distension.  ?   Palpations: Abdomen is soft. There is no mass.  ?   Tenderness: There is no abdominal tenderness. There is no right CVA tenderness, left CVA tenderness, guarding or rebound.  ?   Hernia: No hernia is present.  ?Musculoskeletal:  ?   Cervical back: Normal range of motion and neck supple. No rigidity or tenderness.  ?   Right lower leg: No edema.  ?   Left lower leg: No edema.  ?Lymphadenopathy:  ?   Cervical: No cervical adenopathy.  ?Skin: ?   General: Skin is warm.  ?   Coloration: Skin is not jaundiced or pale.  ?   Findings: No bruising, erythema, lesion or rash.  ?Neurological:  ?   General: No focal deficit present.  ?   Mental Status: He is alert and oriented to person, place, and time. Mental status is at baseline.  ?   Cranial Nerves: No cranial nerve deficit.  ?   Sensory: No sensory deficit.  ?   Motor: No weakness.  ?   Coordination: Coordination normal.  ?   Gait: Gait normal.  ?   Deep Tendon Reflexes: Reflexes normal.  ?Psychiatric:     ?   Mood and Affect: Mood normal.     ?   Behavior: Behavior normal.     ?   Thought Content: Thought content normal.     ?   Judgment: Judgment normal.  ? ? ? ? ? ?   ?Assessment & Plan:  ?Benign essential HTN - Plan: BASIC METABOLIC PANEL WITH GFR, Testosterone, Lipid panel, PSA, CBC with  Differential/Platelet ? ?Prostate cancer screening - Plan:  Testosterone, Lipid panel, PSA, CBC with Differential/Platelet ? ?Adverse effect of testosterone, subsequent encounter - Plan: Testosterone, Lipid panel, PSA, CBC with Differential/Platelet ?Due to his previous history of polycythemia, I would like to check a hemoglobin level on a CBC.  I would also like to check his testosterone level as well as a PSA to screen for prostate cancer.  In addition I will check a fasting lipid panel.  However I will do this when he comes in fasting after he has been.  In the meantime I will just check a BMP to monitor potassium and renal function due to taking lisinopril and hydrochlorothiazide.  Schedule the patient for Cologuard once he has insurance per his request. ?

## 2021-11-20 ENCOUNTER — Ambulatory Visit (INDEPENDENT_AMBULATORY_CARE_PROVIDER_SITE_OTHER): Payer: Self-pay | Admitting: Family Medicine

## 2021-11-20 VITALS — BP 136/84 | HR 79 | Ht 70.0 in | Wt 219.0 lb

## 2021-11-20 DIAGNOSIS — L03317 Cellulitis of buttock: Secondary | ICD-10-CM

## 2021-11-20 DIAGNOSIS — B028 Zoster with other complications: Secondary | ICD-10-CM

## 2021-11-20 MED ORDER — VALACYCLOVIR HCL 1 G PO TABS: 1000.0000 mg | ORAL_TABLET | Freq: Three times a day (TID) | ORAL | 0 refills | Status: AC

## 2021-11-20 MED ORDER — DOXYCYCLINE HYCLATE 100 MG PO TABS: 100.0000 mg | ORAL_TABLET | Freq: Two times a day (BID) | ORAL | 0 refills | Status: DC

## 2021-11-20 NOTE — Progress Notes (Signed)
Subjective:    Patient ID: Timothy Calderon, male    DOB: 12-20-1965, 56 y.o.   MRN: [id number]  HPI  Patient has a cluster of blisters around his anus and his right gluteus.  It has been there for about 2 weeks.  Many are scabbed over.  They are in a dermatomal pattern for S2 and S3.  They itch and burn and hurt.  He is also developed a secondary cellulitis in the skin around his rectum.  There is no hemorrhoid.  There is no abscess.  I believe one of the herpetiform ulcers has gotten secondarily infected due to the location. Past Medical History:  Diagnosis Date   DDD (degenerative disc disease), lumbar    L4-5   HSV (herpes simplex virus) infection    Hypertension    Past Surgical History:  Procedure Laterality Date   NECK SURGERY     2 ruptured disc, 2019, [facility]-    VASECTOMY     Current Outpatient Medications on File Prior to Visit  Medication Sig Dispense Refill   aspirin EC 81 MG tablet Take 81 mg by mouth daily.     hydrochlorothiazide (HYDRODIURIL) 25 MG tablet Take 1 tablet (25 mg total) by mouth daily. 90 tablet 3   lisinopril (ZESTRIL) 40 MG tablet Take 1 tablet (40 mg total) by mouth daily. 90 tablet 3   valACYclovir (VALTREX) 500 MG tablet Take 1 tablet (500 mg total) by mouth daily. 90 tablet 3   No current facility-administered medications on file prior to visit.   Allergies  Allergen Reactions   Elemental Sulfur Hives   Penicillins Other (See Comments)    Was told when he was a kid he was allergic    Social History   Socioeconomic History   Marital status: Single    Spouse name: Not on file   Number of children: Not on file   Years of education: Not on file   Highest education level: Not on file  Occupational History   Not on file  Tobacco Use   Smoking status: Some Days    Packs/day: 0.10    Years: 24.00    Total pack years: 2.40    Types: Cigarettes   Smokeless tobacco: Never  Substance and Sexual Activity   Alcohol use: Yes    Alcohol/week: 2.0  standard drinks of alcohol    Types: 2 Cans of beer per week   Drug use: No   Sexual activity: Never  Other Topics Concern   Not on file  Social History Narrative   Not on file   Social Determinants of Health   Financial Resource Strain: Not on file  Food Insecurity: Not on file  Transportation Needs: Not on file  Physical Activity: Not on file  Stress: Not on file  Social Connections: Not on file  Intimate Partner Violence: Not on file   Family History  Problem Relation Age of Onset   Cancer Father        Bladder   Heart disease Father    Cancer Mother        pancreatic   Heart disease Maternal Uncle    Heart disease Paternal Uncle       Review of Systems  All other systems reviewed and are negative.      Objective:   Physical Exam Vitals reviewed.  Constitutional:      General: He is not in acute distress.    Appearance: He is normal weight. He is not  ill-appearing, toxic-appearing or diaphoretic.  HENT:     Head: Normocephalic and atraumatic.  Cardiovascular:     Rate and Rhythm: Normal rate and regular rhythm.     Pulses: Normal pulses.     Heart sounds: Normal heart sounds. No murmur heard.    No friction rub.  Pulmonary:     Effort: Pulmonary effort is normal. No respiratory distress.     Breath sounds: Normal breath sounds. No stridor. No wheezing, rhonchi or rales.  Chest:     Chest wall: No tenderness.  Genitourinary:   Neurological:     Mental Status: He is alert.           Assessment & Plan:  Herpes zoster with other complication  Cellulitis of buttock Patient is outside the duration of time of effectiveness but I will still try Valtrex 1000 mg 3 times daily for 7 days.  We will treat the secondary cellulitis with doxycycline 100 mg p.o. twice daily for 7 days.  We discussed options for postherpetic neuralgia and he is comfortable with ibuprofen.

## 2021-11-22 ENCOUNTER — Ambulatory Visit: Payer: Self-pay | Admitting: Family Medicine

## 2021-11-27 ENCOUNTER — Telehealth: Payer: Self-pay

## 2021-11-27 ENCOUNTER — Other Ambulatory Visit: Payer: Self-pay | Admitting: Family Medicine

## 2021-11-27 NOTE — Telephone Encounter (Signed)
Pt called in stating that his last prescription for a med (doxycycline) was not filled the way the dr instructed pt to take this med. Pt states that he only received 7 days worth of meds. Pt stated that the med is working for his issue, pt just didn't want his symptoms to reoccur if he didn't take meds as instructed by his dr. Please advise.   Pt would like a cb if this prescription is going to be resent to the pharmacy please.  Cb#: [phone]

## 2021-11-28 NOTE — Telephone Encounter (Signed)
Patient called back to follow up on medication prescribed for shingles. Script for   valACYclovir (VALTREX) 1000 MG tablet only dispensed 21 tablets. Based on dose instructions of 1 pill TID, patient needs an additional 9 pills. Patient unsure if provider ok with him taking just 7 days or if he needs the additional 3 days.  Spoke with provider's nurse; stated she will speak with Dr. Dennard Schaumann and advise patient of his reply.  Please advise patient at [phone]

## 2021-12-10 ENCOUNTER — Encounter: Payer: Self-pay | Admitting: Family Medicine

## 2021-12-10 ENCOUNTER — Ambulatory Visit (INDEPENDENT_AMBULATORY_CARE_PROVIDER_SITE_OTHER): Payer: Self-pay | Admitting: Family Medicine

## 2021-12-10 ENCOUNTER — Ambulatory Visit: Payer: Self-pay | Admitting: Family Medicine

## 2021-12-10 VITALS — BP 142/86 | HR 83 | Ht 70.0 in | Wt 219.0 lb

## 2021-12-10 DIAGNOSIS — L308 Other specified dermatitis: Secondary | ICD-10-CM

## 2021-12-10 MED ORDER — PREDNISONE 20 MG PO TABS: ORAL_TABLET | ORAL | 0 refills | Status: AC

## 2021-12-10 MED ORDER — TRIAMCINOLONE ACETONIDE 0.1 % EX CREA: 1.0000 | TOPICAL_CREAM | Freq: Two times a day (BID) | CUTANEOUS | 0 refills | Status: DC

## 2021-12-10 NOTE — Progress Notes (Signed)
Subjective:    Patient ID: Timothy Calderon, male    DOB: 1965-03-13, 56 y.o.   MRN: [id number]  HPI  11/20/21 Patient has a cluster of blisters around his anus and his right gluteus.  It has been there for about 2 weeks.  Many are scabbed over.  They are in a dermatomal pattern for S2 and S3.  They itch and burn and hurt.  He is also developed a secondary cellulitis in the skin around his rectum.  There is no hemorrhoid.  There is no abscess.  I believe one of the herpetiform ulcers has gotten secondarily infected due to the location.  At that time, my plan was: Patient is outside the duration of time of effectiveness but I will still try Valtrex 1000 mg 3 times daily for 7 days.  We will treat the secondary cellulitis with doxycycline 100 mg p.o. twice daily for 7 days.  We discussed options for postherpetic neuralgia and he is comfortable with ibuprofen.  12/10/21 Patient continues to report burning around his rectum and around his gluteus.  He states that it hurts to wipe after he defecates.  He also has erythema and irritation at the base of his penis despite having taken the Valtrex and the doxycycline.  See the photograph below.  This is the plaque that is on his gluteus today.  The skin there is a violaceous in color.  There is scaling.  There are fissures all throughout that is weeping serous fluid.  There are no discernible vesicles anymore as this has: Marland Kitchen  There is erythema at the base of the penis but there are no discernible vesicles. Past Medical History:  Diagnosis Date   DDD (degenerative disc disease), lumbar    L4-5   HSV (herpes simplex virus) infection    Hypertension    Past Surgical History:  Procedure Laterality Date   NECK SURGERY     2 ruptured disc, 2019, [facility]-    VASECTOMY     Current Outpatient Medications on File Prior to Visit  Medication Sig Dispense Refill   aspirin EC 81 MG tablet Take 81 mg by mouth daily.     hydrochlorothiazide (HYDRODIURIL) 25 MG  tablet Take 1 tablet (25 mg total) by mouth daily. 90 tablet 3   lisinopril (ZESTRIL) 40 MG tablet Take 1 tablet (40 mg total) by mouth daily. 90 tablet 3   valACYclovir (VALTREX) 500 MG tablet Take 1 tablet (500 mg total) by mouth daily. 90 tablet 3   No current facility-administered medications on file prior to visit.   Allergies  Allergen Reactions   Elemental Sulfur Hives   Penicillins Other (See Comments)    Was told when he was a kid he was allergic    Social History   Socioeconomic History   Marital status: Single    Spouse name: Not on file   Number of children: Not on file   Years of education: Not on file   Highest education level: Not on file  Occupational History   Not on file  Tobacco Use   Smoking status: Some Days    Packs/day: 0.10    Years: 24.00    Total pack years: 2.40    Types: Cigarettes   Smokeless tobacco: Never  Substance and Sexual Activity   Alcohol use: Yes    Alcohol/week: 2.0 standard drinks of alcohol    Types: 2 Cans of beer per week   Drug use: No   Sexual activity: Never  Other Topics Concern   Not on file  Social History Narrative   Not on file   Social Determinants of Health   Financial Resource Strain: Not on file  Food Insecurity: Not on file  Transportation Needs: Not on file  Physical Activity: Not on file  Stress: Not on file  Social Connections: Not on file  Intimate Partner Violence: Not on file   Family History  Problem Relation Age of Onset   Cancer Father        Bladder   Heart disease Father    Cancer Mother        pancreatic   Heart disease Maternal Uncle    Heart disease Paternal Uncle       Review of Systems  All other systems reviewed and are negative.      Objective:   Physical Exam Vitals reviewed.  Constitutional:      General: He is not in acute distress.    Appearance: He is normal weight. He is not ill-appearing, toxic-appearing or diaphoretic.  HENT:     Head: Normocephalic and  atraumatic.  Cardiovascular:     Rate and Rhythm: Normal rate and regular rhythm.     Pulses: Normal pulses.     Heart sounds: Normal heart sounds. No murmur heard.    No friction rub.  Pulmonary:     Effort: Pulmonary effort is normal. No respiratory distress.     Breath sounds: Normal breath sounds. No stridor. No wheezing, rhonchi or rales.  Chest:     Chest wall: No tenderness.  Genitourinary:   Neurological:     Mental Status: He is alert.           Assessment & Plan:  Psoriasiform eruption - Plan: predniSONE (DELTASONE) 20 MG tablet, triamcinolone cream (KENALOG) 0.1 %, Ambulatory referral to Dermatology Will begin his shingles has now progressed to a violaceous plaque with fissures and scale.  I am not certain but I do believe the majority of I am seeing today is inflammatory and does not represent any type of active infection.  I will treat this with a prednisone taper pack to calm down the initial inflammation and then use triamcinolone cream twice daily.  I recommended he stop applying Neosporin which he has been applying in case this is some type of allergic reaction to the Neosporin.  He can use Vaseline as an emollient initially and then switch to triamcinolone after he is off the prednisone.  I will consult dermatology for second opinion.

## 2022-01-07 ENCOUNTER — Encounter: Payer: Self-pay | Admitting: Family Medicine

## 2022-01-08 ENCOUNTER — Other Ambulatory Visit: Payer: Self-pay | Admitting: Family Medicine

## 2022-01-08 DIAGNOSIS — L308 Other specified dermatitis: Secondary | ICD-10-CM

## 2022-01-08 MED ORDER — TRIAMCINOLONE ACETONIDE 0.1 % EX CREA: 1.0000 | TOPICAL_CREAM | Freq: Two times a day (BID) | CUTANEOUS | 2 refills | Status: DC

## 2022-03-31 ENCOUNTER — Encounter: Payer: Self-pay | Admitting: Family Medicine

## 2022-04-01 ENCOUNTER — Other Ambulatory Visit: Payer: Self-pay | Admitting: Family Medicine

## 2022-04-01 DIAGNOSIS — L308 Other specified dermatitis: Secondary | ICD-10-CM

## 2022-04-01 MED ORDER — TRIAMCINOLONE ACETONIDE 0.1 % EX CREA: 1.0000 | TOPICAL_CREAM | Freq: Two times a day (BID) | CUTANEOUS | 2 refills | Status: AC

## 2022-04-25 ENCOUNTER — Ambulatory Visit: Payer: Self-pay | Admitting: Orthopedic Surgery

## 2022-04-30 ENCOUNTER — Other Ambulatory Visit: Payer: Self-pay | Admitting: Family Medicine

## 2022-06-13 ENCOUNTER — Ambulatory Visit: Payer: 59 | Admitting: Family Medicine

## 2022-06-13 ENCOUNTER — Encounter: Payer: Self-pay | Admitting: Family Medicine

## 2022-06-13 VITALS — BP 140/90 | HR 81 | Temp 98.1°F | Ht 70.0 in | Wt 214.0 lb

## 2022-06-13 DIAGNOSIS — J069 Acute upper respiratory infection, unspecified: Secondary | ICD-10-CM | POA: Diagnosis not present

## 2022-06-13 DIAGNOSIS — J029 Acute pharyngitis, unspecified: Secondary | ICD-10-CM

## 2022-06-13 NOTE — Assessment & Plan Note (Signed)

## 2022-06-13 NOTE — Progress Notes (Signed)
Subjective:  HPI: Timothy Calderon is a 57 y.o. male presenting on 06/13/2022 for Sore Throat (Home covid test is neg)   Patient reports 4 days of sweats, body aches, sore throat, lethargic, mild dry cough, nasal congestion Denies fever, ear pain, sinus pressure, N/V/D, headache No known sick exposures, negative covid Has tried Nyquil   ROS: Negative unless specifically indicated above in HPI.   Relevant past medical history reviewed and updated as indicated.   Past Medical History:  Diagnosis Date   DDD (degenerative disc disease), lumbar    L4-5   HSV (herpes simplex virus) infection    Hypertension      Past Surgical History:  Procedure Laterality Date   NECK SURGERY     2 ruptured disc, 2019, [facility]CDF-    VASECTOMY      Allergies and medications reviewed and updated.   Current Outpatient Medications:    aspirin EC 81 MG tablet, Take 81 mg by mouth daily., Disp: , Rfl:    hydrochlorothiazide (HYDRODIURIL) 25 MG tablet, TAKE 1 TABLET (25 MG TOTAL) BY MOUTH DAILY., Disp: 90 tablet, Rfl: 3   lisinopril (ZESTRIL) 40 MG tablet, TAKE 1 TABLET BY MOUTH EVERY DAY, Disp: 90 tablet, Rfl: 3   predniSONE (DELTASONE) 20 MG tablet, 3 tabs poqday 1-2, 2 tabs poqday 3-4, 1 tab poqday 5-6, Disp: 12 tablet, Rfl: 0   triamcinolone cream (KENALOG) 0.1 %, Apply 1 Application topically 2 (two) times daily., Disp: 80 g, Rfl: 2   valACYclovir (VALTREX) 500 MG tablet, TAKE 1 TABLET (500 MG TOTAL) BY MOUTH DAILY., Disp: 90 tablet, Rfl: 3  Allergies  Allergen Reactions   Elemental Sulfur Hives   Penicillins Other (See Comments)    Was told when he was a kid he was allergic     Objective:   BP (!) 140/90   Pulse 81   Temp 98.1 F (36.7 C) (Oral)   Ht 5\' 10"  (1.778 m)   Wt 214 lb (97.1 kg)   SpO2 95%   BMI 30.71 kg/m    Physical Exam Vitals reviewed.  Constitutional:      Appearance: Normal appearance. He is normal weight.  HENT:     Head: Normocephalic and atraumatic.     Right  Ear: Tympanic membrane and ear canal normal.     Left Ear: Tympanic membrane and ear canal normal.     Nose: Congestion present. No rhinorrhea.     Mouth/Throat:     Mouth: Mucous membranes are moist.     Pharynx: Oropharynx is clear.     Tonsils: No tonsillar exudate or tonsillar abscesses.  Eyes:     Conjunctiva/sclera: Conjunctivae normal.  Cardiovascular:     Rate and Rhythm: Normal rate and regular rhythm.     Pulses: Normal pulses.     Heart sounds: Normal heart sounds.  Pulmonary:     Effort: Pulmonary effort is normal.     Breath sounds: Normal breath sounds.  Lymphadenopathy:     Cervical: No cervical adenopathy.  Skin:    General: Skin is warm and dry.     Capillary Refill: Capillary refill takes less than 2 seconds.  Neurological:     General: No focal deficit present.     Mental Status: He is alert and oriented to person, place, and time. Mental status is at baseline.  Psychiatric:        Mood and Affect: Mood normal.        Behavior: Behavior normal.  Thought Content: Thought content normal.        Judgment: Judgment normal.     Assessment & Plan:  Viral URI Assessment & Plan: Reassured patient that symptoms and exam findings are most consistent with a viral upper respiratory infection and explained lack of efficacy of antibiotics against viruses.  Discussed expected course and features suggestive of secondary bacterial infection.  Continue supportive care. Increase fluid intake with water or electrolyte solution like pedialyte. Encouraged acetaminophen as needed for fever/pain. Encouraged salt water gargling, chloraseptic spray and throat lozenges. Encouraged OTC guaifenesin. Encouraged saline sinus flushes and/or neti with humidified air.     Sore throat -     STREP GROUP A AG, W/REFLEX TO CULT; Future -     Culture, Group A Strep     Follow up plan: Return if symptoms worsen or fail to improve.  Park Meo, FNP

## 2022-06-13 NOTE — Patient Instructions (Signed)

## 2022-06-15 LAB — CULTURE, GROUP A STREP
MICRO NUMBER:: 14904758
SPECIMEN QUALITY:: ADEQUATE

## 2022-06-15 LAB — STREP GROUP A AG, W/REFLEX TO CULT: Streptococcus Group A AG: NOT DETECTED

## 2022-06-18 ENCOUNTER — Ambulatory Visit (INDEPENDENT_AMBULATORY_CARE_PROVIDER_SITE_OTHER): Payer: 59 | Admitting: Student

## 2022-06-18 ENCOUNTER — Ambulatory Visit (HOSPITAL_BASED_OUTPATIENT_CLINIC_OR_DEPARTMENT_OTHER): Payer: 59

## 2022-06-18 ENCOUNTER — Encounter (HOSPITAL_BASED_OUTPATIENT_CLINIC_OR_DEPARTMENT_OTHER): Payer: Self-pay | Admitting: Student

## 2022-06-18 ENCOUNTER — Other Ambulatory Visit (HOSPITAL_BASED_OUTPATIENT_CLINIC_OR_DEPARTMENT_OTHER): Payer: Self-pay | Admitting: Student

## 2022-06-18 DIAGNOSIS — S46219A Strain of muscle, fascia and tendon of other parts of biceps, unspecified arm, initial encounter: Secondary | ICD-10-CM | POA: Diagnosis not present

## 2022-06-18 DIAGNOSIS — M25522 Pain in left elbow: Secondary | ICD-10-CM

## 2022-06-18 NOTE — Progress Notes (Signed)
Chief Complaint: Left arm pain     History of Present Illness:    Timothy Calderon is a 57 y.o. male presenting to clinic for evaluation of pain in his left arm.  He states that earlier this afternoon he was lifting heavy building materials from a truck bed and reached down with his left arm to prevent it from dropping when he felt a pop in the front of his upper left arm.  He states that it was immediately painful and reports that his left arm no longer looks normal.  Reports significant pain and difficulty with elbow extension and supination.  Has not tried any treatment options up to this point.  Numbness or tingling.  He currently works as a Product/process development scientist.  Surgical History:   None  PMH/PSH/Family History/Social History/Meds/Allergies:    Past Medical History:  Diagnosis Date   DDD (degenerative disc disease), lumbar    L4-5   HSV (herpes simplex virus) infection    Hypertension    Past Surgical History:  Procedure Laterality Date   NECK SURGERY     2 ruptured disc, 2019, [facility]-    VASECTOMY     Social History   Socioeconomic History   Marital status: Single    Spouse name: Not on file   Number of children: Not on file   Years of education: Not on file   Highest education level: Not on file  Occupational History   Not on file  Tobacco Use   Smoking status: Some Days    Packs/day: 0.10    Years: 24.00    Additional pack years: 0.00    Total pack years: 2.40    Types: Cigarettes   Smokeless tobacco: Never  Substance and Sexual Activity   Alcohol use: Yes    Alcohol/week: 2.0 standard drinks of alcohol    Types: 2 Cans of beer per week   Drug use: No   Sexual activity: Never  Other Topics Concern   Not on file  Social History Narrative   Not on file   Social Determinants of Health   Financial Resource Strain: Not on file  Food Insecurity: Not on file  Transportation Needs: Not on file  Physical Activity: Not on file   Stress: Not on file  Social Connections: Not on file   Family History  Problem Relation Age of Onset   Cancer Father        Bladder   Heart disease Father    Cancer Mother        pancreatic   Heart disease Maternal Uncle    Heart disease Paternal Uncle    Allergies  Allergen Reactions   Elemental Sulfur Hives   Penicillins Other (See Comments)    Was told when he was a kid he was allergic    Current Outpatient Medications  Medication Sig Dispense Refill   aspirin EC 81 MG tablet Take 81 mg by mouth daily.     hydrochlorothiazide (HYDRODIURIL) 25 MG tablet TAKE 1 TABLET (25 MG TOTAL) BY MOUTH DAILY. 90 tablet 3   lisinopril (ZESTRIL) 40 MG tablet TAKE 1 TABLET BY MOUTH EVERY DAY 90 tablet 3   predniSONE (DELTASONE) 20 MG tablet 3 tabs poqday 1-2, 2 tabs poqday 3-4, 1 tab poqday 5-6 12 tablet 0   triamcinolone cream (KENALOG)  0.1 % Apply 1 Application topically 2 (two) times daily. 80 g 2   valACYclovir (VALTREX) 500 MG tablet TAKE 1 TABLET (500 MG TOTAL) BY MOUTH DAILY. 90 tablet 3   No current facility-administered medications for this visit.   No results found.  Review of Systems:   A ROS was performed including pertinent positives and negatives as documented in the HPI.  Physical Exam :   Constitutional: NAD and appears stated age Neurological: Alert and oriented Psych: Appropriate affect and cooperative There were no vitals taken for this visit.   Comprehensive Musculoskeletal Exam:    There is an apparent deformity of the left upper arm as the biceps muscle appears approximately retracted.  He has tenderness over the antecubital fossa and anterior distal third of the upper arm.  No tenderness with palpation over the anterior shoulder or proximal radius/ulna.  Active elbow range of motion to 15 degrees extension with difficulty and 120 degrees flexion.  Pain is aggravated with supination of the forearm.  Brachial and radial pulses 2+.  Distal neurosensory exam  intact.  Imaging:   Xray (left elbow 2 views): Negative   I personally reviewed and interpreted the radiographs.   Assessment:   57 y.o. male presenting with left upper arm pain after an injury earlier today.  Overall his exam appears very consistent with a distal biceps rupture.  I would like to proceed with a stat MRI at this time for further assessment.  I will plan to have him follow-up shortly after his MRI with Dr. Steward Drone for review and possible surgical discussion. Patient did express that he does a lot of lifting and manual labor for work so he does want to regain as much strength as possible. He is out of town this Thursday-Sunday for his son's college graduation so this may be scheduled sometime next week. Discussed to have him reach out once his MRI is scheduled.  Plan :    - Return to clinic for MRI follow up     I personally saw and evaluated the patient, and participated in the management and treatment plan.  Hazle Nordmann, PA-C Orthopedics  This document was dictated using Conservation officer, historic buildings. A reasonable attempt at proof reading has been made to minimize errors.

## 2022-06-20 ENCOUNTER — Other Ambulatory Visit: Payer: 59

## 2022-06-21 ENCOUNTER — Encounter (HOSPITAL_BASED_OUTPATIENT_CLINIC_OR_DEPARTMENT_OTHER): Payer: Self-pay

## 2022-06-23 ENCOUNTER — Other Ambulatory Visit (HOSPITAL_BASED_OUTPATIENT_CLINIC_OR_DEPARTMENT_OTHER): Payer: Self-pay | Admitting: Orthopaedic Surgery

## 2022-06-23 MED ORDER — LORAZEPAM 1 MG PO TABS: 1.0000 mg | ORAL_TABLET | Freq: Three times a day (TID) | ORAL | 0 refills | Status: AC

## 2022-06-24 ENCOUNTER — Ambulatory Visit (HOSPITAL_COMMUNITY)
Admission: RE | Admit: 2022-06-24 | Discharge: 2022-06-24 | Disposition: A | Discharge status: Home / Self Care | Payer: 59 | Source: Ambulatory Visit | Attending: Student | Admitting: Student

## 2022-06-24 DIAGNOSIS — S46219A Strain of muscle, fascia and tendon of other parts of biceps, unspecified arm, initial encounter: Secondary | ICD-10-CM | POA: Diagnosis present

## 2022-07-03 ENCOUNTER — Ambulatory Visit (INDEPENDENT_AMBULATORY_CARE_PROVIDER_SITE_OTHER): Payer: 59 | Admitting: Orthopaedic Surgery

## 2022-07-03 ENCOUNTER — Other Ambulatory Visit (HOSPITAL_BASED_OUTPATIENT_CLINIC_OR_DEPARTMENT_OTHER): Payer: Self-pay

## 2022-07-03 DIAGNOSIS — S46219A Strain of muscle, fascia and tendon of other parts of biceps, unspecified arm, initial encounter: Secondary | ICD-10-CM | POA: Diagnosis not present

## 2022-07-03 MED ORDER — IBUPROFEN 800 MG PO TABS
800.0000 mg | ORAL_TABLET | Freq: Three times a day (TID) | ORAL | 0 refills | Status: AC
  Filled 2022-07-03: qty 30, 10d supply, fill #0

## 2022-07-03 MED ORDER — ACETAMINOPHEN 500 MG PO TABS
500.0000 mg | ORAL_TABLET | Freq: Three times a day (TID) | ORAL | 0 refills | Status: AC
  Filled 2022-07-03: qty 30, 10d supply, fill #0

## 2022-07-03 MED ORDER — OXYCODONE HCL 5 MG PO TABS
5.0000 mg | ORAL_TABLET | ORAL | 0 refills | Status: AC | PRN
  Filled 2022-07-03: qty 5, 1d supply, fill #0

## 2022-07-03 NOTE — Progress Notes (Signed)
Chief Complaint: Left arm pain        History of Present Illness:    07/03/2022: Timothy Calderon presents today for his follow-up MRI of the left arm distal bicep tendon   AKIEM KOSKIE is a 57 y.o. male presenting to clinic for evaluation of pain in his left arm.  He states that earlier this afternoon he was lifting heavy building materials from a truck bed and reached down with his left arm to prevent it from dropping when he felt a pop in the front of his upper left arm.  He states that it was immediately painful and reports that his left arm no longer looks normal.  Reports significant pain and difficulty with elbow extension and supination.  Has not tried any treatment options up to this point.  Numbness or tingling.  He currently works as a Product/process development scientist.   Surgical History:   None   PMH/PSH/Family History/Social History/Meds/Allergies:         Past Medical History:  Diagnosis Date   DDD (degenerative disc disease), lumbar      L4-5   HSV (herpes simplex virus) infection     Hypertension           Past Surgical History:  Procedure Laterality Date   NECK SURGERY        2 ruptured disc, 2019, [facility]-    VASECTOMY        Social History         Socioeconomic History   Marital status: Single      Spouse name: Not on file   Number of children: Not on file   Years of education: Not on file   Highest education level: Not on file  Occupational History   Not on file  Tobacco Use   Smoking status: Some Days      Packs/day: 0.10      Years: 24.00      Additional pack years: 0.00      Total pack years: 2.40      Types: Cigarettes   Smokeless tobacco: Never  Substance and Sexual Activity   Alcohol use: Yes      Alcohol/week: 2.0 standard drinks of alcohol      Types: 2 Cans of beer per week   Drug use: No   Sexual activity: Never  Other Topics Concern   Not on file  Social History Narrative   Not on file    Social Determinants of Health    Financial  Resource Strain: Not on file  Food Insecurity: Not on file  Transportation Needs: Not on file  Physical Activity: Not on file  Stress: Not on file  Social Connections: Not on file         Family History  Problem Relation Age of Onset   Cancer Father          Bladder   Heart disease Father     Cancer Mother          pancreatic   Heart disease Maternal Uncle     Heart disease Paternal Uncle           Allergies  Allergen Reactions   Elemental Sulfur Hives   Penicillins Other (See Comments)      Was told when he was a kid he was allergic           Current Outpatient Medications  Medication Sig Dispense Refill   aspirin EC 81 MG tablet Take 81 mg  by mouth daily.       hydrochlorothiazide (HYDRODIURIL) 25 MG tablet TAKE 1 TABLET (25 MG TOTAL) BY MOUTH DAILY. 90 tablet 3   lisinopril (ZESTRIL) 40 MG tablet TAKE 1 TABLET BY MOUTH EVERY DAY 90 tablet 3   predniSONE (DELTASONE) 20 MG tablet 3 tabs poqday 1-2, 2 tabs poqday 3-4, 1 tab poqday 5-6 12 tablet 0   triamcinolone cream (KENALOG) 0.1 % Apply 1 Application topically 2 (two) times daily. 80 g 2   valACYclovir (VALTREX) 500 MG tablet TAKE 1 TABLET (500 MG TOTAL) BY MOUTH DAILY. 90 tablet 3    No current facility-administered medications for this visit.    Imaging Results (Last 48 hours)  No results found.     Review of Systems:   A ROS was performed including pertinent positives and negatives as documented in the HPI.   Physical Exam :   Constitutional: NAD and appears stated age Neurological: Alert and oriented Psych: Appropriate affect and cooperative There were no vitals taken for this visit.    Comprehensive Musculoskeletal Exam:     There is an apparent deformity of the left upper arm as the biceps muscle appears approximately retracted.  He has tenderness over the antecubital fossa and anterior distal third of the upper arm.  No tenderness with palpation over the anterior shoulder or proximal radius/ulna.  Active  elbow range of motion to 15 degrees extension with difficulty and 120 degrees flexion.  Pain is aggravated with supination of the forearm.  Brachial and radial pulses 2+.  Distal neurosensory exam intact.   Imaging:   Xray (left elbow 2 views): Negative   MRI left elbow: Complete tear of the biceps with retraction to the distal upper arm   I personally reviewed and interpreted the radiographs.     Assessment:   57 y.o. male presenting with left distal biceps complete tendon tear.  Overall he is very active and shoulders to the gym routinely.  He is very concerned about residual deficits in strength with both flexion and supination.  I did discuss risks and benefits of fixation.  We did specifically discuss radial nerve palsy.  I did describe that this is unlikely.  This time he would like to proceed with a left distal biceps repair Plan :     -Plan for left endoscopic distal biceps repair   After a lengthy discussion of treatment options, including risks, benefits, alternatives, complications of surgical and nonsurgical conservative options, the patient elected surgical repair.   The patient  is aware of the material risks  and complications including, but not limited to injury to adjacent structures, neurovascular injury, infection, numbness, bleeding, implant failure, thermal burns, stiffness, persistent pain, failure to heal, disease transmission from allograft, need for further surgery, dislocation, anesthetic risks, blood clots, risks of death,and others. The probabilities of surgical success and failure discussed with patient given their particular co-morbidities.The time and nature of expected rehabilitation and recovery was discussed.The patient's questions were all answered preoperatively.  No barriers to understanding were noted. I explained the natural history of the disease process and Rx rationale.  I explained to the patient what I considered to be reasonable expectations given  their personal situation.  The final treatment plan was arrived at through a shared patient decision making process model.

## 2022-07-03 NOTE — H&P (View-Only) (Signed)
    Chief Complaint: Left arm pain        History of Present Illness:    07/03/2022: Jeff presents today for his follow-up MRI of the left arm distal bicep tendon   Titan R Yaw is a 57 y.o. male presenting to clinic for evaluation of pain in his left arm.  He states that earlier this afternoon he was lifting heavy building materials from a truck bed and reached down with his left arm to prevent it from dropping when he felt a pop in the front of his upper left arm.  He states that it was immediately painful and reports that his left arm no longer looks normal.  Reports significant pain and difficulty with elbow extension and supination.  Has not tried any treatment options up to this point.  Numbness or tingling.  He currently works as a general contractor.   Surgical History:   None   PMH/PSH/Family History/Social History/Meds/Allergies:         Past Medical History:  Diagnosis Date   DDD (degenerative disc disease), lumbar      L4-5   HSV (herpes simplex virus) infection     Hypertension           Past Surgical History:  Procedure Laterality Date   NECK SURGERY        2 ruptured disc, 2019, [facility]-    VASECTOMY        Social History         Socioeconomic History   Marital status: Single      Spouse name: Not on file   Number of children: Not on file   Years of education: Not on file   Highest education level: Not on file  Occupational History   Not on file  Tobacco Use   Smoking status: Some Days      Packs/day: 0.10      Years: 24.00      Additional pack years: 0.00      Total pack years: 2.40      Types: Cigarettes   Smokeless tobacco: Never  Substance and Sexual Activity   Alcohol use: Yes      Alcohol/week: 2.0 standard drinks of alcohol      Types: 2 Cans of beer per week   Drug use: No   Sexual activity: Never  Other Topics Concern   Not on file  Social History Narrative   Not on file    Social Determinants of Health    Financial  Resource Strain: Not on file  Food Insecurity: Not on file  Transportation Needs: Not on file  Physical Activity: Not on file  Stress: Not on file  Social Connections: Not on file         Family History  Problem Relation Age of Onset   Cancer Father          Bladder   Heart disease Father     Cancer Mother          pancreatic   Heart disease Maternal Uncle     Heart disease Paternal Uncle           Allergies  Allergen Reactions   Elemental Sulfur Hives   Penicillins Other (See Comments)      Was told when he was a kid he was allergic           Current Outpatient Medications  Medication Sig Dispense Refill   aspirin EC 81 MG tablet Take 81 mg   by mouth daily.       hydrochlorothiazide (HYDRODIURIL) 25 MG tablet TAKE 1 TABLET (25 MG TOTAL) BY MOUTH DAILY. 90 tablet 3   lisinopril (ZESTRIL) 40 MG tablet TAKE 1 TABLET BY MOUTH EVERY DAY 90 tablet 3   predniSONE (DELTASONE) 20 MG tablet 3 tabs poqday 1-2, 2 tabs poqday 3-4, 1 tab poqday 5-6 12 tablet 0   triamcinolone cream (KENALOG) 0.1 % Apply 1 Application topically 2 (two) times daily. 80 g 2   valACYclovir (VALTREX) 500 MG tablet TAKE 1 TABLET (500 MG TOTAL) BY MOUTH DAILY. 90 tablet 3    No current facility-administered medications for this visit.    Imaging Results (Last 48 hours)  No results found.     Review of Systems:   A ROS was performed including pertinent positives and negatives as documented in the HPI.   Physical Exam :   Constitutional: NAD and appears stated age Neurological: Alert and oriented Psych: Appropriate affect and cooperative There were no vitals taken for this visit.    Comprehensive Musculoskeletal Exam:     There is an apparent deformity of the left upper arm as the biceps muscle appears approximately retracted.  He has tenderness over the antecubital fossa and anterior distal third of the upper arm.  No tenderness with palpation over the anterior shoulder or proximal radius/ulna.  Active  elbow range of motion to 15 degrees extension with difficulty and 120 degrees flexion.  Pain is aggravated with supination of the forearm.  Brachial and radial pulses 2+.  Distal neurosensory exam intact.   Imaging:   Xray (left elbow 2 views): Negative   MRI left elbow: Complete tear of the biceps with retraction to the distal upper arm   I personally reviewed and interpreted the radiographs.     Assessment:   57 y.o. male presenting with left distal biceps complete tendon tear.  Overall he is very active and shoulders to the gym routinely.  He is very concerned about residual deficits in strength with both flexion and supination.  I did discuss risks and benefits of fixation.  We did specifically discuss radial nerve palsy.  I did describe that this is unlikely.  This time he would like to proceed with a left distal biceps repair Plan :     -Plan for left endoscopic distal biceps repair   After a lengthy discussion of treatment options, including risks, benefits, alternatives, complications of surgical and nonsurgical conservative options, the patient elected surgical repair.   The patient  is aware of the material risks  and complications including, but not limited to injury to adjacent structures, neurovascular injury, infection, numbness, bleeding, implant failure, thermal burns, stiffness, persistent pain, failure to heal, disease transmission from allograft, need for further surgery, dislocation, anesthetic risks, blood clots, risks of death,and others. The probabilities of surgical success and failure discussed with patient given their particular co-morbidities.The time and nature of expected rehabilitation and recovery was discussed.The patient's questions were all answered preoperatively.  No barriers to understanding were noted. I explained the natural history of the disease process and Rx rationale.  I explained to the patient what I considered to be reasonable expectations given  their personal situation.  The final treatment plan was arrived at through a shared patient decision making process model.          

## 2022-07-04 ENCOUNTER — Encounter (HOSPITAL_BASED_OUTPATIENT_CLINIC_OR_DEPARTMENT_OTHER): Payer: Self-pay | Admitting: Orthopaedic Surgery

## 2022-07-04 ENCOUNTER — Other Ambulatory Visit: Payer: Self-pay

## 2022-07-04 ENCOUNTER — Ambulatory Visit (HOSPITAL_BASED_OUTPATIENT_CLINIC_OR_DEPARTMENT_OTHER): Payer: Self-pay | Admitting: Orthopaedic Surgery

## 2022-07-04 DIAGNOSIS — S46219A Strain of muscle, fascia and tendon of other parts of biceps, unspecified arm, initial encounter: Secondary | ICD-10-CM

## 2022-07-04 NOTE — Progress Notes (Signed)
Pt states no respiratory symptoms since 06/19/22, including cough, sputum, or fever. Chart reviewed with Dr. Stephannie Peters, anesthesiologist. Rip Harbour to proceed with surgery at [facility] as planned pending any significant changes to pt health per Dr. Stephannie Peters.

## 2022-07-04 NOTE — Progress Notes (Signed)
   07/04/22 1011  OBSTRUCTIVE SLEEP APNEA  Have you ever been diagnosed with sleep apnea through a sleep study? No  Do you snore loudly (loud enough to be heard through closed doors)?  1  Do you often feel tired, fatigued, or sleepy during the daytime (such as falling asleep during driving or talking to someone)? 0  Has anyone observed you stop breathing during your sleep? 1  Do you have, or are you being treated for high blood pressure? 1  BMI more than 35 kg/m2? 0  Age > 50 (1-yes) 1  Male Gender (Yes=1) 1  Obstructive Sleep Apnea Score 5

## 2022-07-10 NOTE — Anesthesia Preprocedure Evaluation (Addendum)
Anesthesia Evaluation  Patient identified by MRN, date of birth, ID band Patient awake    Reviewed: Allergy & Precautions, H&P , NPO status , Patient's Chart, lab work & pertinent test results  Airway Mallampati: I  TM Distance: >3 FB Neck ROM: Full    Dental no notable dental hx. (+) Teeth Intact, Dental Advisory Given,    Pulmonary neg pulmonary ROS, Current Smoker and Patient abstained from smoking.   Pulmonary exam normal breath sounds clear to auscultation       Cardiovascular Exercise Tolerance: Good hypertension, Pt. on medications negative cardio ROS Normal cardiovascular exam Rhythm:Regular Rate:Normal     Neuro/Psych negative neurological ROS  negative psych ROS   GI/Hepatic negative GI ROS, Neg liver ROS,,,  Endo/Other  negative endocrine ROS    Renal/GU negative Renal ROS  negative genitourinary   Musculoskeletal negative musculoskeletal ROS (+) Arthritis , Osteoarthritis,    Abdominal   Peds negative pediatric ROS (+)  Hematology negative hematology ROS (+)   Anesthesia Other Findings   Reproductive/Obstetrics negative OB ROS                             Anesthesia Physical Anesthesia Plan  ASA: 2  Anesthesia Plan: General and Regional   Post-op Pain Management: Regional block*, Minimal or no pain anticipated and Tylenol PO (pre-op)*   Induction: Intravenous  PONV Risk Score and Plan: 2 and Ondansetron, Dexamethasone, Propofol infusion and Treatment may vary due to age or medical condition  Airway Management Planned: Oral ETT and LMA  Additional Equipment: None  Intra-op Plan:   Post-operative Plan: Extubation in OR  Informed Consent: I have reviewed the patients History and Physical, chart, labs and discussed the procedure including the risks, benefits and alternatives for the proposed anesthesia with the patient or authorized representative who has indicated  his/her understanding and acceptance.     Dental advisory given  Plan Discussed with: Anesthesiologist and CRNA  Anesthesia Plan Comments: (Discussed both nerve block for pain relief post-op and GA; including NV, sore throat, dental injury, and pulmonary complications)       Anesthesia Quick Evaluation

## 2022-07-11 ENCOUNTER — Ambulatory Visit (HOSPITAL_BASED_OUTPATIENT_CLINIC_OR_DEPARTMENT_OTHER): Payer: 59 | Admitting: Anesthesiology

## 2022-07-11 ENCOUNTER — Encounter (HOSPITAL_BASED_OUTPATIENT_CLINIC_OR_DEPARTMENT_OTHER): Payer: Self-pay | Admitting: Orthopaedic Surgery

## 2022-07-11 ENCOUNTER — Ambulatory Visit (HOSPITAL_BASED_OUTPATIENT_CLINIC_OR_DEPARTMENT_OTHER): Payer: 59

## 2022-07-11 ENCOUNTER — Ambulatory Visit (HOSPITAL_BASED_OUTPATIENT_CLINIC_OR_DEPARTMENT_OTHER)
Admission: RE | Admit: 2022-07-11 | Discharge: 2022-07-11 | Disposition: A | Discharge status: Home / Self Care | Payer: 59 | Attending: Orthopaedic Surgery | Admitting: Orthopaedic Surgery

## 2022-07-11 ENCOUNTER — Other Ambulatory Visit (HOSPITAL_BASED_OUTPATIENT_CLINIC_OR_DEPARTMENT_OTHER): Payer: Self-pay | Admitting: Orthopaedic Surgery

## 2022-07-11 ENCOUNTER — Other Ambulatory Visit: Payer: Self-pay

## 2022-07-11 ENCOUNTER — Encounter (HOSPITAL_BASED_OUTPATIENT_CLINIC_OR_DEPARTMENT_OTHER)
Admission: RE | Disposition: A | Discharge status: Home / Self Care | Payer: Self-pay | Source: Home / Self Care | Attending: Orthopaedic Surgery

## 2022-07-11 DIAGNOSIS — X500XXA Overexertion from strenuous movement or load, initial encounter: Secondary | ICD-10-CM | POA: Insufficient documentation

## 2022-07-11 DIAGNOSIS — S46219A Strain of muscle, fascia and tendon of other parts of biceps, unspecified arm, initial encounter: Secondary | ICD-10-CM | POA: Diagnosis not present

## 2022-07-11 DIAGNOSIS — I10 Essential (primary) hypertension: Secondary | ICD-10-CM | POA: Insufficient documentation

## 2022-07-11 DIAGNOSIS — Z981 Arthrodesis status: Secondary | ICD-10-CM | POA: Diagnosis not present

## 2022-07-11 DIAGNOSIS — S46212D Strain of muscle, fascia and tendon of other parts of biceps, left arm, subsequent encounter: Secondary | ICD-10-CM | POA: Diagnosis not present

## 2022-07-11 DIAGNOSIS — F17210 Nicotine dependence, cigarettes, uncomplicated: Secondary | ICD-10-CM | POA: Diagnosis not present

## 2022-07-11 DIAGNOSIS — Z79899 Other long term (current) drug therapy: Secondary | ICD-10-CM | POA: Insufficient documentation

## 2022-07-11 DIAGNOSIS — M1990 Unspecified osteoarthritis, unspecified site: Secondary | ICD-10-CM | POA: Insufficient documentation

## 2022-07-11 DIAGNOSIS — S46212A Strain of muscle, fascia and tendon of other parts of biceps, left arm, initial encounter: Secondary | ICD-10-CM | POA: Insufficient documentation

## 2022-07-11 DIAGNOSIS — Z01818 Encounter for other preprocedural examination: Secondary | ICD-10-CM

## 2022-07-11 HISTORY — PX: DISTAL BICEPS TENDON REPAIR: SHX1461

## 2022-07-11 LAB — BASIC METABOLIC PANEL
Anion gap: 9 (ref 5–15)
BUN: 14 mg/dL (ref 6–20)
CO2: 28 mmol/L (ref 22–32)
Calcium: 9.4 mg/dL (ref 8.9–10.3)
Chloride: 96 mmol/L — ABNORMAL LOW (ref 98–111)
Creatinine, Ser: 1.1 mg/dL (ref 0.61–1.24)
GFR, Estimated: >60 mL/min (ref >60)
Glucose, Bld: 87 mg/dL (ref 70–99)
Potassium: 3.6 mmol/L (ref 3.5–5.1)
Sodium: 133 mmol/L — ABNORMAL LOW (ref 135–145)

## 2022-07-11 SURGERY — REPAIR, TENDON, BICEPS, DISTAL
Anesthesia: Regional | Site: Elbow | Laterality: Left

## 2022-07-11 MED ORDER — OXYCODONE HCL 5 MG PO TABS: 5.0000 mg | ORAL_TABLET | Freq: Once | ORAL | Status: DC | PRN

## 2022-07-11 MED ORDER — MEPERIDINE HCL 25 MG/ML IJ SOLN: 6.2500 mg | INTRAMUSCULAR | Status: DC | PRN

## 2022-07-11 MED ORDER — MIDAZOLAM HCL 2 MG/2ML IJ SOLN
INTRAMUSCULAR | Status: AC
  Filled 2022-07-11: qty 2

## 2022-07-11 MED ORDER — GABAPENTIN 300 MG PO CAPS
ORAL_CAPSULE | ORAL | Status: AC
  Filled 2022-07-11: qty 1

## 2022-07-11 MED ORDER — LIDOCAINE HCL (CARDIAC) PF 100 MG/5ML IV SOSY
PREFILLED_SYRINGE | INTRAVENOUS | Status: DC | PRN
  Administered 2022-07-11: 80 mg via INTRAVENOUS

## 2022-07-11 MED ORDER — OXYCODONE HCL 5 MG/5ML PO SOLN: 5.0000 mg | Freq: Once | ORAL | Status: DC | PRN

## 2022-07-11 MED ORDER — TRANEXAMIC ACID-NACL 1000-0.7 MG/100ML-% IV SOLN
1000.0000 mg | INTRAVENOUS | Status: AC
  Administered 2022-07-11: 1000 mg via INTRAVENOUS

## 2022-07-11 MED ORDER — FENTANYL CITRATE (PF) 100 MCG/2ML IJ SOLN: 25.0000 ug | INTRAMUSCULAR | Status: DC | PRN

## 2022-07-11 MED ORDER — ACETAMINOPHEN 500 MG PO TABS
ORAL_TABLET | ORAL | Status: AC
  Filled 2022-07-11: qty 2

## 2022-07-11 MED ORDER — GABAPENTIN 300 MG PO CAPS
300.0000 mg | ORAL_CAPSULE | Freq: Once | ORAL | Status: AC
  Administered 2022-07-11: 300 mg via ORAL

## 2022-07-11 MED ORDER — ONDANSETRON HCL 4 MG/2ML IJ SOLN
INTRAMUSCULAR | Status: AC
  Filled 2022-07-11: qty 2

## 2022-07-11 MED ORDER — ACETAMINOPHEN 160 MG/5ML PO SOLN: 325.0000 mg | ORAL | Status: DC | PRN

## 2022-07-11 MED ORDER — SODIUM CHLORIDE 0.9 % IR SOLN
Status: DC | PRN
  Administered 2022-07-11: 750 mL

## 2022-07-11 MED ORDER — MIDAZOLAM HCL 5 MG/5ML IJ SOLN
INTRAMUSCULAR | Status: DC | PRN
  Administered 2022-07-11: 2 mg via INTRAVENOUS

## 2022-07-11 MED ORDER — DEXAMETHASONE SODIUM PHOSPHATE 10 MG/ML IJ SOLN
INTRAMUSCULAR | Status: AC
  Filled 2022-07-11: qty 1

## 2022-07-11 MED ORDER — PROPOFOL 10 MG/ML IV BOLUS
INTRAVENOUS | Status: DC | PRN
  Administered 2022-07-11: 200 mg via INTRAVENOUS

## 2022-07-11 MED ORDER — PHENYLEPHRINE HCL (PRESSORS) 10 MG/ML IV SOLN
INTRAVENOUS | Status: DC | PRN
  Administered 2022-07-11 (×3): 80 ug via INTRAVENOUS

## 2022-07-11 MED ORDER — ACETAMINOPHEN 325 MG PO TABS: 325.0000 mg | ORAL_TABLET | ORAL | Status: DC | PRN

## 2022-07-11 MED ORDER — ONDANSETRON HCL 4 MG/2ML IJ SOLN: 4.0000 mg | Freq: Once | INTRAMUSCULAR | Status: DC | PRN

## 2022-07-11 MED ORDER — LACTATED RINGERS IV SOLN: INTRAVENOUS | Status: DC

## 2022-07-11 MED ORDER — BUPIVACAINE HCL (PF) 0.5 % IJ SOLN
INTRAMUSCULAR | Status: DC | PRN
  Administered 2022-07-11: 15 mL via PERINEURAL

## 2022-07-11 MED ORDER — CEFAZOLIN SODIUM-DEXTROSE 2-4 GM/100ML-% IV SOLN
INTRAVENOUS | Status: AC
  Filled 2022-07-11: qty 100

## 2022-07-11 MED ORDER — DEXAMETHASONE SODIUM PHOSPHATE 10 MG/ML IJ SOLN
INTRAMUSCULAR | Status: DC | PRN
  Administered 2022-07-11: 10 mg via INTRAVENOUS

## 2022-07-11 MED ORDER — PROPOFOL 500 MG/50ML IV EMUL
INTRAVENOUS | Status: AC
  Filled 2022-07-11: qty 50

## 2022-07-11 MED ORDER — EPHEDRINE SULFATE (PRESSORS) 50 MG/ML IJ SOLN
INTRAMUSCULAR | Status: DC | PRN
  Administered 2022-07-11: 10 mg via INTRAVENOUS
  Administered 2022-07-11: 5 mg via INTRAVENOUS
  Administered 2022-07-11: 10 mg via INTRAVENOUS

## 2022-07-11 MED ORDER — DEXMEDETOMIDINE HCL IN NACL 80 MCG/20ML IV SOLN
INTRAVENOUS | Status: DC | PRN
  Administered 2022-07-11: 8 ug via INTRAVENOUS

## 2022-07-11 MED ORDER — FENTANYL CITRATE (PF) 100 MCG/2ML IJ SOLN
100.0000 ug | Freq: Once | INTRAMUSCULAR | Status: AC
  Administered 2022-07-11: 100 ug via INTRAVENOUS

## 2022-07-11 MED ORDER — LIDOCAINE 2% (20 MG/ML) 5 ML SYRINGE
INTRAMUSCULAR | Status: AC
  Filled 2022-07-11: qty 5

## 2022-07-11 MED ORDER — TRANEXAMIC ACID-NACL 1000-0.7 MG/100ML-% IV SOLN
INTRAVENOUS | Status: AC
  Filled 2022-07-11: qty 100

## 2022-07-11 MED ORDER — ACETAMINOPHEN 500 MG PO TABS
1000.0000 mg | ORAL_TABLET | Freq: Once | ORAL | Status: AC
  Administered 2022-07-11: 1000 mg via ORAL

## 2022-07-11 MED ORDER — MIDAZOLAM HCL 2 MG/2ML IJ SOLN
2.0000 mg | Freq: Once | INTRAMUSCULAR | Status: AC
  Administered 2022-07-11: 2 mg via INTRAVENOUS

## 2022-07-11 MED ORDER — ONDANSETRON HCL 4 MG/2ML IJ SOLN
INTRAMUSCULAR | Status: DC | PRN
  Administered 2022-07-11: 4 mg via INTRAVENOUS

## 2022-07-11 MED ORDER — BUPIVACAINE LIPOSOME 1.3 % IJ SUSP
INTRAMUSCULAR | Status: DC | PRN
  Administered 2022-07-11: 10 mL via PERINEURAL

## 2022-07-11 MED ORDER — CEFAZOLIN SODIUM-DEXTROSE 2-4 GM/100ML-% IV SOLN
2.0000 g | INTRAVENOUS | Status: AC
  Administered 2022-07-11: 2 g via INTRAVENOUS

## 2022-07-11 MED ORDER — FENTANYL CITRATE (PF) 100 MCG/2ML IJ SOLN
INTRAMUSCULAR | Status: AC
  Filled 2022-07-11: qty 2

## 2022-07-11 SURGICAL SUPPLY — 82 items
ADH SKN CLS APL DERMABOND .7 (GAUZE/BANDAGES/DRESSINGS)
APL PRP STRL LF DISP 70% ISPRP (MISCELLANEOUS) ×2
BLADE SURG 15 STRL LF DISP TIS (BLADE) ×2 IMPLANT
BLADE SURG 15 STRL SS (BLADE) ×2
BNDG CMPR 5X4 CHSV STRCH STRL (GAUZE/BANDAGES/DRESSINGS) ×1
BNDG CMPR 5X62 HK CLSR LF (GAUZE/BANDAGES/DRESSINGS) ×1
BNDG CMPR 6"X 5 YARDS HK CLSR (GAUZE/BANDAGES/DRESSINGS) ×1
BNDG CMPR 9X4 STRL LF SNTH (GAUZE/BANDAGES/DRESSINGS)
BNDG CMPR THK2 5X4 CHSV NS (GAUZE/BANDAGES/DRESSINGS) ×1
BNDG COHESIVE 4X5 TAN NS LF (GAUZE/BANDAGES/DRESSINGS) ×1 IMPLANT
BNDG COHESIVE 4X5 TAN STRL LF (GAUZE/BANDAGES/DRESSINGS) IMPLANT
BNDG ELASTIC 6INX 5YD STR LF (GAUZE/BANDAGES/DRESSINGS) ×1 IMPLANT
BNDG ESMARK 4X9 LF (GAUZE/BANDAGES/DRESSINGS) ×1 IMPLANT
CHLORAPREP W/TINT 26 (MISCELLANEOUS) ×1 IMPLANT
CORD BIPOLAR FORCEPS 12FT (ELECTRODE) ×1 IMPLANT
COVER BACK TABLE 60X90IN (DRAPES) ×1 IMPLANT
CUFF TOURN SGL QUICK 18X3 (MISCELLANEOUS) ×1 IMPLANT
CUFF TOURN SGL QUICK 18X4 (TOURNIQUET CUFF) IMPLANT
CUFF TOURN SGL QUICK 24 (TOURNIQUET CUFF)
CUFF TRNQT CYL 24X4X16.5-23 (TOURNIQUET CUFF) IMPLANT
DERMABOND ADVANCED .7 DNX12 (GAUZE/BANDAGES/DRESSINGS) IMPLANT
DISSECTOR  3.8MM X 13CM (MISCELLANEOUS)
DISSECTOR 3.8MM X 13CM (MISCELLANEOUS) IMPLANT
DRAPE C-ARM 42X72 X-RAY (DRAPES) ×1 IMPLANT
DRAPE EXTREMITY T 121X128X90 (DISPOSABLE) ×1 IMPLANT
DRAPE IMP U-DRAPE 54X76 (DRAPES) ×1 IMPLANT
DRAPE OEC MINIVIEW 54X84 (DRAPES) IMPLANT
DRAPE SURG 17X23 STRL (DRAPES) ×1 IMPLANT
GAUZE PAD ABD 8X10 STRL (GAUZE/BANDAGES/DRESSINGS) IMPLANT
GAUZE SPONGE 4X4 12PLY STRL (GAUZE/BANDAGES/DRESSINGS) ×1 IMPLANT
GAUZE XEROFORM 1X8 LF (GAUZE/BANDAGES/DRESSINGS) IMPLANT
GLOVE BIO SURGEON STRL SZ 6 (GLOVE) ×1 IMPLANT
GLOVE BIO SURGEON STRL SZ7.5 (GLOVE) ×1 IMPLANT
GLOVE BIOGEL PI IND STRL 6.5 (GLOVE) ×1 IMPLANT
GLOVE BIOGEL PI IND STRL 7.0 (GLOVE) IMPLANT
GLOVE BIOGEL PI IND STRL 8 (GLOVE) ×1 IMPLANT
GLOVE ECLIPSE 6.5 STRL STRAW (GLOVE) IMPLANT
GOWN STRL REUS W/ TWL LRG LVL3 (GOWN DISPOSABLE) ×2 IMPLANT
GOWN STRL REUS W/ TWL XL LVL3 (GOWN DISPOSABLE) ×1 IMPLANT
GOWN STRL REUS W/TWL LRG LVL3 (GOWN DISPOSABLE) ×2
GOWN STRL REUS W/TWL XL LVL3 (GOWN DISPOSABLE) ×1
GOWN STRL SURGICAL XL XLNG (GOWN DISPOSABLE) ×1 IMPLANT
INSERTER BUTTON (SYSTAGENIX WOUND MANAGEMENT) IMPLANT
NDL SUT 6 .5 CRC .975X.05 MAYO (NEEDLE) IMPLANT
NEEDLE MAYO TAPER (NEEDLE)
NS IRRIG 1000ML POUR BTL (IV SOLUTION) ×1 IMPLANT
PACK BASIN DAY SURGERY FS (CUSTOM PROCEDURE TRAY) ×1 IMPLANT
PAD CAST 4YDX4 CTTN HI CHSV (CAST SUPPLIES) ×2 IMPLANT
PADDING CAST COTTON 4X4 STRL (CAST SUPPLIES) ×2
PADDING CAST SYNTHETIC 3X4 NS (CAST SUPPLIES) IMPLANT
PADDING CAST SYNTHETIC 4X4 STR (CAST SUPPLIES) IMPLANT
PIN DRILL ACL TIGHTROPE 4MM (PIN) IMPLANT
SHAVER DISSECTOR 3.0 (BURR) IMPLANT
SHAVER SABRE 2.0 (BURR) IMPLANT
SHEET MEDIUM DRAPE 40X70 STRL (DRAPES) ×1 IMPLANT
SLEEVE SCD COMPRESS KNEE MED (STOCKING) ×1 IMPLANT
SLING ARM FOAM STRAP XLG (SOFTGOODS) IMPLANT
SPIKE FLUID TRANSFER (MISCELLANEOUS) IMPLANT
SPLINT FIBERGLASS 3X35 (CAST SUPPLIES) ×30 IMPLANT
SPLINT FIBERGLASS 4X30 (CAST SUPPLIES) ×1 IMPLANT
SPONGE T-LAP 18X18 ~~LOC~~+RFID (SPONGE) ×1 IMPLANT
STOCKINETTE 4X48 STRL (DRAPES) ×1 IMPLANT
SUT ETHILON 3 0 PS 1 (SUTURE) IMPLANT
SUT FIBERWIRE #2 38 T-5 BLUE (SUTURE)
SUT MNCRL AB 4-0 PS2 18 (SUTURE) IMPLANT
SUT SILK 0 TIES 10X30 (SUTURE) IMPLANT
SUT VIC AB 2-0 SH 27 (SUTURE)
SUT VIC AB 2-0 SH 27XBRD (SUTURE) IMPLANT
SUT VIC AB 3-0 PS1 18 (SUTURE)
SUT VIC AB 3-0 PS1 18XBRD (SUTURE) IMPLANT
SUTURE FIBERWR #2 38 T-5 BLUE (SUTURE) IMPLANT
SUTURE TAPE 1.3 FIBERLOP 20 ST (SUTURE) IMPLANT
SUTURETAPE 1.3 FIBERLOOP 20 ST (SUTURE) ×1
SYR BULB EAR ULCER 3OZ GRN STR (SYRINGE) ×1 IMPLANT
SYR CONTROL 10ML LL (SYRINGE) IMPLANT
SYS FBRTK BUTTON 2.6 (Anchor) ×2 IMPLANT
SYSTEM FBRTK BUTTON 2.6 (Anchor) IMPLANT
TOWEL GREEN STERILE FF (TOWEL DISPOSABLE) ×2 IMPLANT
TUBE CONNECTING 20X1/4 (TUBING) IMPLANT
TUBING ARTHROSCOPY IRRIG 16FT (MISCELLANEOUS) IMPLANT
UNDERPAD 30X36 HEAVY ABSORB (UNDERPADS AND DIAPERS) ×1 IMPLANT
YANKAUER SUCT BULB TIP NO VENT (SUCTIONS) IMPLANT

## 2022-07-11 NOTE — Anesthesia Procedure Notes (Signed)
Anesthesia Regional Block: Supraclavicular block   Pre-Anesthetic Checklist: , timeout performed,  Correct Patient, Correct Site, Correct Laterality,  Correct Procedure, Correct Position, site marked,  Risks and benefits discussed,  Surgical consent,  Pre-op evaluation,  At surgeon's request and post-op pain management  Laterality: Left  Prep: chloraprep       Needles:  Injection technique: Single-shot  Needle Type: Echogenic Stimulator Needle     Needle Length: 5cm  Needle Gauge: 22     Additional Needles:   Procedures:, nerve stimulator,,, ultrasound used (permanent image in chart),,     Nerve Stimulator or Paresthesia:  Response: hand, 0.45 mA  Additional Responses:   Narrative:  Start time: 07/11/2022 7:49 AM End time: 07/11/2022 7:55 AM Injection made incrementally with aspirations every 5 mL.  Performed by: Personally  Anesthesiologist: Bethena Midget, MD  Additional Notes: Functioning IV was confirmed and monitors were applied.  A 50mm 22ga Arrow echogenic stimulator needle was used. Sterile prep and drape,hand hygiene and sterile gloves were used. Ultrasound guidance: relevant anatomy identified, needle position confirmed, local anesthetic spread visualized around nerve(s)., vascular puncture avoided.  Image printed for medical record. Negative aspiration and negative test dose prior to incremental administration of local anesthetic. The patient tolerated the procedure well.

## 2022-07-11 NOTE — Anesthesia Postprocedure Evaluation (Signed)
Anesthesia Post Note  Patient: BRAELON BROCKMEIER  Procedure(s) Performed: LEFT ENDOSCOPIC ELBOW DISTAL BICEPS REPAIR (Left: Elbow)     Patient location during evaluation: PACU Anesthesia Type: Regional and General Level of consciousness: awake and alert Pain management: pain level controlled Vital Signs Assessment: post-procedure vital signs reviewed and stable Respiratory status: spontaneous breathing, nonlabored ventilation, respiratory function stable and patient connected to nasal cannula oxygen Cardiovascular status: blood pressure returned to baseline and stable Postop Assessment: no apparent nausea or vomiting Anesthetic complications: no  No notable events documented.  Last Vitals:  Vitals:   07/11/22 1110 07/11/22 1115  BP: (!) 122/90 (!) 137/91  Pulse: 84 80  Resp: 20 16  Temp:  36.5 C  SpO2: 93% 93%    Last Pain:  Vitals:   07/11/22 1115  TempSrc:   PainSc: 0-No pain                 Shirleymae Hauth

## 2022-07-11 NOTE — Progress Notes (Signed)
Assisted Dr. Oddono with left, supraclavicular, ultrasound guided block. Side rails up, monitors on throughout procedure. See vital signs in flow sheet. Tolerated Procedure well. 

## 2022-07-11 NOTE — Brief Op Note (Signed)
   Brief Op Note  Date of Surgery: 07/11/2022  Preoperative Diagnosis: LEFT DISTAL BICEPS TEAR  Postoperative Diagnosis: same  Procedure: Procedure(s): LEFT ENDOSCOPIC ELBOW DISTAL BICEPS REPAIR  Implants: Implant Name Type Inv. Item Serial No. Manufacturer Lot No. LRB No. Used Action  SYS FBRTK BUTTON 2.6 - [id number] Anchor SYS FBRTK BUTTON 2.6  [employer] [id number] Left 1 Implanted  SYS FBRTK BUTTON 2.6 - [id number] Anchor SYS FBRTK BUTTON 2.6  [employer] [id number] Left 1 Implanted    Surgeons: Surgeon(s): Huel Cote, MD  Anesthesia: General    Estimated Blood Loss: See anesthesia record  Complications: None  Condition to PACU: Stable  Benancio Deeds, MD 07/11/2022 10:26 AM

## 2022-07-11 NOTE — Discharge Instructions (Addendum)
Discharge Instructions    Attending Surgeon: Huel Cote, MD Office Phone Number: [phone]   Diagnosis and Procedures:    Surgeries Performed: Left distal biceps repair  Discharge Plan:    Diet: Resume usual diet. Begin with light or bland foods.  Drink plenty of fluids.  Activity:  Non weight bearing left arm. You are advised to go home directly from the hospital or surgical center. Restrict your activities.  GENERAL INSTRUCTIONS: 1.  Please apply ice to your wound to help with swelling and inflammation. This will improve your comfort and your overall recovery following surgery.     2. Please call Dr. Serena Croissant office at [phone] with questions Monday-Friday during business hours. If no one answers, please leave a message and someone should get back to the patient within 24 hours. For emergencies please call 911 or proceed to the emergency room.   3. Patient to notify surgical team if experiences any of the following: Bowel/Bladder dysfunction, uncontrolled pain, nerve/muscle weakness, incision with increased drainage or redness, nausea/vomiting and Fever greater than 101.0 F.  Be alert for signs of infection including redness, streaking, odor, fever or chills. Be alert for excessive pain or bleeding and notify your surgeon immediately.  WOUND INSTRUCTIONS:   Leave your dressing, cast, or splint in place until your post operative visit.  Keep it clean and dry.  Always keep the incision clean and dry until the staples/sutures are removed. If there is no drainage from the incision you should keep it open to air. If there is drainage from the incision you must keep it covered at all times until the drainage stops  Do not soak in a bath tub, hot tub, pool, lake or other body of water until 21 days after your surgery and your incision is completely dry and healed.  If you have removable sutures (or staples) they must be removed 10-14 days (unless otherwise  instructed) from the day of your surgery.     1)  Elevate the extremity as much as possible.  2)  Keep the dressing clean and dry.  3)  Please call us if the dressing becomes wet or dirty.  4)  If you are experiencing worsening pain or worsening swelling, please call.     MEDICATIONS: Resume all previous home medications at the previous prescribed dose and frequency unless otherwise noted Start taking the  pain medications on an as-needed basis as prescribed  Please taper down pain medication over the next week following surgery.  Ideally you should not require a refill of any narcotic pain medication.  Take pain medication with food to minimize nausea. In addition to the prescribed pain medication, you may take over-the-counter pain relievers such as Tylenol.  Do NOT take additional tylenol if your pain medication already has tylenol in it.  Aspirin 325mg  daily for four weeks.      FOLLOWUP INSTRUCTIONS: 1. Follow up at the Physical Therapy Clinic 3-4 days following surgery. This appointment should be scheduled unless other arrangements have been made.The Physical Therapy scheduling number is [phone] if an appointment has not already been arranged.  2. Contact Dr. Serena Croissant office during office hours at [phone] or the practice after hours line at [phone] for non-emergencies. For medical emergencies call 911.   Discharge Location: Home    No TYLENOL until 1:30 p.m.   Post Anesthesia Home Care Instructions  Activity: Get plenty of rest for the remainder of the day. A responsible individual must stay with  you for 24 hours following the procedure.  For the next 24 hours, DO NOT: -Drive a car -Advertising copywriter -Drink alcoholic beverages -Take any medication unless instructed by your physician -Make any legal decisions or sign important papers.  Meals: Start with liquid foods such as gelatin or soup. Progress to regular foods as tolerated. Avoid greasy, spicy,  heavy foods. If nausea and/or vomiting occur, drink only clear liquids until the nausea and/or vomiting subsides. Call your physician if vomiting continues.  Special Instructions/Symptoms: Your throat may feel dry or sore from the anesthesia or the breathing tube placed in your throat during surgery. If this causes discomfort, gargle with warm salt water. The discomfort should disappear within 24 hours.  If you had a scopolamine patch placed behind your ear for the management of post- operative nausea and/or vomiting:  1. The medication in the patch is effective for 72 hours, after which it should be removed.  Wrap patch in a tissue and discard in the trash. Wash hands thoroughly with soap and water. 2. You may remove the patch earlier than 72 hours if you experience unpleasant side effects which may include dry mouth, dizziness or visual disturbances. 3. Avoid touching the patch. Wash your hands with soap and water after contact with the patch.  No Tylenol until after 1:30pm today if needed    Regional Anesthesia Blocks  1. Numbness or the inability to move the "blocked" extremity may last from 3-48 hours after placement. The length of time depends on the medication injected and your individual response to the medication. If the numbness is not going away after 48 hours, call your surgeon.  2. The extremity that is blocked will need to be protected until the numbness is gone and the  Strength has returned. Because you cannot feel it, you will need to take extra care to avoid injury. Because it may be weak, you may have difficulty moving it or using it. You may not know what position it is in without looking at it while the block is in effect.  3. For blocks in the legs and feet, returning to weight bearing and walking needs to be done carefully. You will need to wait until the numbness is entirely gone and the strength has returned. You should be able to move your leg and foot normally before you  try and bear weight or walk. You will need someone to be with you when you first try to ensure you do not fall and possibly risk injury.  4. Bruising and tenderness at the needle site are common side effects and will resolve in a few days.  5. Persistent numbness or new problems with movement should be communicated to the surgeon or the [facility] [phone] [facility] [phone]). Information for Discharge Teaching: EXPAREL (bupivacaine liposome injectable suspension)   Your surgeon or anesthesiologist gave you EXPAREL(bupivacaine) to help control your pain after surgery.  EXPAREL is a local anesthetic that provides pain relief by numbing the tissue around the surgical site. EXPAREL is designed to release pain medication over time and can control pain for up to 72 hours. Depending on how you respond to EXPAREL, you may require less pain medication during your recovery.  Possible side effects: Temporary loss of sensation or ability to move in the area where bupivacaine was injected. Nausea, vomiting, constipation Rarely, numbness and tingling in your mouth or lips, lightheadedness, or anxiety may occur. Call your doctor right away if you think you  may be experiencing any of these sensations, or if you have other questions regarding possible side effects.  Follow all other discharge instructions given to you by your surgeon or nurse. Eat a healthy diet and drink plenty of water or other fluids.  If you return to the hospital for any reason within 96 hours following the administration of EXPAREL, it is important for health care providers to know that you have received this anesthetic. A teal colored band has been placed on your arm with the date, time and amount of EXPAREL you have received in order to alert and inform your health care providers. Please leave this armband in place for the full 96 hours following administration, and then you may remove the band.

## 2022-07-11 NOTE — Op Note (Signed)
Date of Surgery: 07/11/2022  INDICATIONS: Timothy Calderon is a 57 y.o.-year-old male with with a left complete distal biceps rupture.  The risk and benefits of the procedure were discussed in detail and documented in the pre-operative evaluation.   PREOPERATIVE DIAGNOSIS: 1.  Left complete distal biceps rupture  POSTOPERATIVE DIAGNOSIS: Same.  PROCEDURE: 1.  Left endoscopic distal biceps repair  SURGEON: Benancio Deeds MD  ASSISTANT: Kerby Less, ATC  ANESTHESIA:  general plus interscalene nerve block  IV FLUIDS AND URINE: See anesthesia record.  ANTIBIOTICS: Ancef  ESTIMATED BLOOD LOSS: 10 mL.  IMPLANTS:  Implant Name Type Inv. Item Serial No. Manufacturer Lot No. LRB No. Used Action  SYS FBRTK BUTTON 2.6 - [id number] Anchor SYS FBRTK BUTTON 2.6  [employer] [id number] Left 1 Implanted  SYS FBRTK BUTTON 2.6 - [id number] Anchor SYS FBRTK BUTTON 2.6  [employer] [id number] Left 1 Implanted    DRAINS: None  CULTURES: None  COMPLICATIONS: none  DESCRIPTION OF PROCEDURE:   The patient was identified in the preoperative holding area.  The correct site was marked according universal protocol with nursing.  Anesthesia subsequently performed a peripheral nerve block.  Ancef was given 1 hour prior to skin incision.   He is subsequently taken back to the operating room.  He was placed supine with the arm on a radiolucent arm table.  Anesthesia was induced.  The arm was prepped and draped in the usual sterile fashion.  A tourniquet was applied but not inflated.  Final timeout was performed.  We began with the endoscopic portion of the case.  A small portal was made at the confluence of the FCR and the brachial radialis volarly.  15 blade was used to incise through just skin.  A snap was used to spread through fascia.  A blunt trocar was introduced onto the radial tuberosity and mini C arm confirmed placement.  At this time the radial tuberosity was identified.  A shaver was introduced and the  tuberosity was prepared for onlay of the biceps tendon.  An additional proximal viewing portal was established under direct visualization with the same nick and spread technique 4 cm proximal to the distal incision.  Viewing portal was switched into this.  The anchors were then placed through the distal portal.  A drill was placed and the anchors were placed.  This had excellent wrapping of the biceps tuberosity.  At this time the proximal incision was extended an additional 2 cm.  Metzenbaum scissors was used to spread again through fascia.  The biceps tendon was identified proximally and this was brought through the wound.  2x #2 fiber loop sutures were used to whipstitch the tendon truncating 1 set of sutures proximally and the second set of sutures distally on the tendon.  A looped suture grasper then was brought to the distal portal up through the proximal portal so as to ensure that the biceps would be deep on the correct trajectory.  Both sets of sutures from the biceps tendon whipstitch construct were passed into the distal wound.  At this time the proximal sutures were fed through the proximal fiber button.  These were then tied to each other using a arthroscopic knot pusher.  The distal 2 limbs were passed to the distal fiber button and tied to each other.  This produced an excellent apposition of the biceps down to its native radial tuberosity.  Wound was closed with 3-0 nylon proximally and distally.  Dressings were placed with Xeroform  gauze web roll and an Ace wrap.  All counts were correct at the end of the case.  There were no complications.  He was taken the PACU.     POSTOPERATIVE PLAN: He will be nonweightbearing on the left arm.  I will see him back in 2 weeks for suture removal.  He will be discharged home.   Benancio Deeds, MD 10:26 AM

## 2022-07-11 NOTE — Anesthesia Postprocedure Evaluation (Signed)
Anesthesia Post Note  Patient: Timothy Calderon  Procedure(s) Performed: LEFT ENDOSCOPIC ELBOW DISTAL BICEPS REPAIR (Left: Elbow)     Patient location during evaluation: PACU Anesthesia Type: Regional and General Level of consciousness: awake and alert Pain management: pain level controlled Vital Signs Assessment: post-procedure vital signs reviewed and stable Respiratory status: spontaneous breathing, nonlabored ventilation, respiratory function stable and patient connected to nasal cannula oxygen Cardiovascular status: blood pressure returned to baseline and stable Postop Assessment: no apparent nausea or vomiting Anesthetic complications: no  No notable events documented.  Last Vitals:  Vitals:   07/11/22 1110 07/11/22 1115  BP: (!) 122/90 (!) 137/91  Pulse: 84 80  Resp: 20 16  Temp:  36.5 C  SpO2: 93% 93%    Last Pain:  Vitals:   07/11/22 1115  TempSrc:   PainSc: 0-No pain                 Timothy Calderon      

## 2022-07-11 NOTE — Transfer of Care (Signed)
Immediate Anesthesia Transfer of Care Note  Patient: Timothy Calderon  Procedure(s) Performed: LEFT ENDOSCOPIC ELBOW DISTAL BICEPS REPAIR (Left: Elbow)  Patient Location: PACU  Anesthesia Type:GA combined with regional for post-op pain  Level of Consciousness: sedated  Airway & Oxygen Therapy: Patient Spontanous Breathing and Patient connected to face mask oxygen  Post-op Assessment: Report given to RN and Post -op Vital signs reviewed and stable  Post vital signs: Reviewed and stable  Last Vitals:  Vitals Value Taken Time  BP 95/70 07/11/22 1030  Temp    Pulse 72 07/11/22 1033  Resp 25 07/11/22 1033  SpO2 95 % 07/11/22 1033  Vitals shown include unvalidated device data.  Last Pain:  Vitals:   07/11/22 0719  TempSrc: Oral  PainSc: 0-No pain         Complications: No notable events documented.

## 2022-07-11 NOTE — Interval H&P Note (Signed)
History and Physical Interval Note:  07/11/2022 7:40 AM  Timothy Calderon  has presented today for surgery, with the diagnosis of LEFT DISTAL BICEPS TEAR.  The various methods of treatment have been discussed with the patient and family. After consideration of risks, benefits and other options for treatment, the patient has consented to  Procedure(s): LEFT ENDOSCOPIC ELBOW DISTAL BICEPS REPAIR (Left) as a surgical intervention.  The patient's history has been reviewed, patient examined, no change in status, stable for surgery.  I have reviewed the patient's chart and labs.  Questions were answered to the patient's satisfaction.     Huel Cote

## 2022-07-11 NOTE — Anesthesia Procedure Notes (Signed)
Procedure Name: LMA Insertion Date/Time: 07/11/2022 8:50 AM  Performed by: Burna Cash, CRNAPre-anesthesia Checklist: Patient identified, Emergency Drugs available, Suction available and Patient being monitored Patient Re-evaluated:Patient Re-evaluated prior to induction Oxygen Delivery Method: Circle system utilized Preoxygenation: Pre-oxygenation with 100% oxygen Induction Type: IV induction Ventilation: Mask ventilation without difficulty LMA: LMA inserted LMA Size: 5.0 Number of attempts: 1 Airway Equipment and Method: Bite block Placement Confirmation: positive ETCO2 Tube secured with: Tape Dental Injury: Teeth and Oropharynx as per pre-operative assessment

## 2022-07-12 ENCOUNTER — Encounter (HOSPITAL_BASED_OUTPATIENT_CLINIC_OR_DEPARTMENT_OTHER): Payer: Self-pay | Admitting: Orthopaedic Surgery

## 2022-07-15 ENCOUNTER — Ambulatory Visit (HOSPITAL_BASED_OUTPATIENT_CLINIC_OR_DEPARTMENT_OTHER): Payer: 59 | Attending: Orthopaedic Surgery | Admitting: Physical Therapy

## 2022-07-15 ENCOUNTER — Other Ambulatory Visit: Payer: Self-pay

## 2022-07-15 ENCOUNTER — Encounter (HOSPITAL_BASED_OUTPATIENT_CLINIC_OR_DEPARTMENT_OTHER): Payer: Self-pay | Admitting: Physical Therapy

## 2022-07-15 DIAGNOSIS — S46219A Strain of muscle, fascia and tendon of other parts of biceps, unspecified arm, initial encounter: Secondary | ICD-10-CM | POA: Insufficient documentation

## 2022-07-15 DIAGNOSIS — M25622 Stiffness of left elbow, not elsewhere classified: Secondary | ICD-10-CM | POA: Insufficient documentation

## 2022-07-15 DIAGNOSIS — M25522 Pain in left elbow: Secondary | ICD-10-CM | POA: Diagnosis present

## 2022-07-15 NOTE — Therapy (Addendum)
OUTPATIENT PHYSICAL THERAPY EVALUATION   Patient Name: Timothy Calderon MRN: [id number] DOB:12-Apr-1965, 57 y.o., male Today's Date: 07/15/2022  END OF SESSION:  PT End of Session - 07/15/22 0848     Visit Number 1    Number of Visits 12    Date for PT Re-Evaluation 07/27/22    PT Start Time 0845    PT Stop Time 0921    PT Time Calculation (min) 36 min    Activity Tolerance Patient tolerated treatment well    Behavior During Therapy WFL for tasks assessed/performed             Past Medical History:  Diagnosis Date   DDD (degenerative disc disease), lumbar    L4-5   HSV (herpes simplex virus) infection    Hypertension    Past Surgical History:  Procedure Laterality Date   DISTAL BICEPS TENDON REPAIR Left 07/11/2022   Procedure: LEFT ENDOSCOPIC ELBOW DISTAL BICEPS REPAIR;  Surgeon: Huel Cote, MD;  Location:  SURGERY CENTER;  Service: Orthopedics;  Laterality: Left;   NECK SURGERY     2 ruptured disc, 2019, ACDF-    VASECTOMY     Patient Active Problem List   Diagnosis Date Noted   Rupture of distal biceps tendon, initial encounter 07/11/2022   Viral URI 06/13/2022   HNP (herniated nucleus pulposus), cervical 12/30/2015     REFERRING PROVIDER: Steward Drone, MD  REFERRING DIAG: S46.219A (ICD-10-CM) - Rupture of distal biceps tendon, initial encounter   Rationale for Evaluation and Treatment: Rehabilitation  THERAPY DIAG:  Rupture of distal biceps tendon, initial encounter M25.522 M25.622 ONSET DATE: Post op L distal biceps repair 07/11/2022   SUBJECTIVE:                                                                                                                                                                                           SUBJECTIVE STATEMENT: Was trying to lift a window casing and tore.   PERTINENT HISTORY:  ACDF 2/3 or 3/4  PAIN:  Are you having pain? Yes: NPRS scale: 2/10 Pain location: Lt elbow Pain description:  sore Aggravating factors: moving too quickly or lifting Relieving factors: rest  PRECAUTIONS:  None  WEIGHT BEARING RESTRICTIONS:  Yes NWB  FALLS:  Has patient fallen in last 6 months? No  OCCUPATION:  contractor  PLOF:  Independent  PATIENT GOALS:  Back to work and gym   OBJECTIVE:   PATIENT SURVEYS:  FOTO 4  SENSATION: Tingling every now and then   HAND DOMINANCE:  Right    Body Part #1  elbow  PALPATION: EVAL: no s/s of infection, good  mobility of elbow  UE ROM UPPER EXTREMITY ROM:  Passive ROM Left EVAL   Elbow flexion 88   Elbow extension -20   Wrist flexion    Wrist extension    Wrist ulnar deviation    Wrist radial deviation    Wrist pronation    Wrist supination     (Blank rows = not tested)      TREATMENT:                                                                                                                               DATE: EVAL 6/3 Putty grip Elbow AAROM     PATIENT EDUCATION:  Education details: Anatomy of condition, POC, HEP, exercise form/rationale Person educated: Patient Education method: Explanation, Demonstration, Tactile cues, Verbal cues, and Handouts Education comprehension: verbalized understanding, returned demonstration, verbal cues required, tactile cues required, and needs further education  HOME EXERCISE PROGRAM: [id number]   ASSESSMENT:  CLINICAL IMPRESSION: Patient is a 57 y.o. M who was seen today for physical therapy evaluation and treatment for s/p distal biceps repair following traumatic tear.    OBJECTIVE IMPAIRMENTS: decreased activity tolerance, decreased ROM, decreased strength, increased edema, increased muscle spasms, impaired flexibility, impaired UE functional use, improper body mechanics, and pain.   ACTIVITY LIMITATIONS: carrying, lifting, bathing, dressing, and hygiene/grooming  PARTICIPATION LIMITATIONS: meal prep, cleaning, laundry, driving, shopping, community activity,  occupation, and yard work  PERSONAL FACTORS:  none  are also affecting patient's functional outcome.   REHAB POTENTIAL: Good  CLINICAL DECISION MAKING: Stable/uncomplicated  EVALUATION COMPLEXITY: Low   GOALS: Goals reviewed with patient? Yes  SHORT TERM GOALS: Target date: 6/28  Elbow ext 0 deg Baseline: Goal status: INITIAL    LONG TERM GOALS: Target date: POC date  Meet FOTO goal Baseline:  Goal status: INITIAL  2.  Completing all ADLs without elbow pain Baseline:  Goal status: INITIAL  3.  Progressing gym program with proper form Baseline:  Goal status: INITIAL  4.  Returning to work with understanding of limitations and lifting form Baseline:  Goal status: INITIAL  5.  Biceps strength 85% opposite UE via hand held dynamometry Baseline:  Goal status: INITIAL   PLAN:  PT FREQUENCY: 1-2x/week  PT DURATION: 12 weeks  PLANNED INTERVENTIONS: Therapeutic exercises, Therapeutic activity, Neuromuscular re-education, Patient/Family education, Self Care, Joint mobilization, Dry Needling, Electrical stimulation, Spinal mobilization, Cryotherapy, Moist heat, Taping, Traction, Ultrasound, Ionotophoresis 4mg /ml Dexamethasone, Manual therapy, and Re-evaluation.  PLAN FOR NEXT SESSION: NWB, progress ROM  Nakiyah Beverley C. Shahid Flori PT, DPT 07/15/22 8:22 PM

## 2022-07-21 NOTE — Therapy (Incomplete)
OUTPATIENT PHYSICAL THERAPY TREATMENT NOTE   Patient Name: Timothy Calderon MRN: [id number] DOB:03-22-1965, 57 y.o., male Today's Date: 07/22/2022  END OF SESSION:  PT End of Session - 07/22/22 0947     Visit Number 2    Number of Visits 12    Date for PT Re-Evaluation 07/27/22    PT Start Time 0937    PT Stop Time 1011    PT Time Calculation (min) 34 min    Activity Tolerance Patient tolerated treatment well;No increased pain    Behavior During Therapy WFL for tasks assessed/performed              Past Medical History:  Diagnosis Date   DDD (degenerative disc disease), lumbar    L4-5   HSV (herpes simplex virus) infection    Hypertension    Past Surgical History:  Procedure Laterality Date   DISTAL BICEPS TENDON REPAIR Left 07/11/2022   Procedure: LEFT ENDOSCOPIC ELBOW DISTAL BICEPS REPAIR;  Surgeon: Huel Cote, MD;  Location: [facility];  Service: Orthopedics;  Laterality: Left;   NECK SURGERY     2 ruptured disc, 2019, ACDF-    VASECTOMY     Patient Active Problem List   Diagnosis Date Noted   Rupture of distal biceps tendon, initial encounter 07/11/2022   Viral URI 06/13/2022   HNP (herniated nucleus pulposus), cervical 12/30/2015     REFERRING PROVIDER: Steward Drone, MD  REFERRING DIAG: S46.219A (ICD-10-CM) - Rupture of distal biceps tendon, initial encounter   Rationale for Evaluation and Treatment: Rehabilitation  THERAPY DIAG:  Pain in left elbow  Stiffness of left elbow, not elsewhere classified  ONSET DATE: Post op L distal biceps repair 07/11/2022   SUBJECTIVE:                                                                                                                                                                                           SUBJECTIVE STATEMENT: Pt is 1 week and 4 days s/p L endoscopic distal biceps repair.  Pt denies any adverse effects after prior Rx.  Pt is not taking any medication except advil.  Pt  does get some tingling if he is in certain positions which doesn't last.  He states his elbow has been doing well the past several days.  Pt states he has been careful with L UE.  Pt reports compliance with HEP.        PERTINENT HISTORY:  ACDF 2/3 or 3/4  PAIN:  Are you having pain? Yes: NPRS scale: 1/10 Pain location: Lt elbow Pain description: sore Aggravating factors: moving too quickly or lifting Relieving factors:  rest  PRECAUTIONS:  None  WEIGHT BEARING RESTRICTIONS:  Yes NWB  FALLS:  Has patient fallen in last 6 months? No  OCCUPATION:  contractor  PLOF:  Independent  PATIENT GOALS:  Back to work and gym   OBJECTIVE:   HAND DOMINANCE:  Right    TREATMENT:                                                                                                                               -Reviewed pt response to prior Rx, HEP compliance, and pain level.     -Pt performed wrist flexion/extension AROM in neutral position and hand pumps in neutral position.     -Pt received L elbow flexion/extension PROM with forearm in neutral per pt and tissue tolerance w/n protocol limits.    -See below for pt education.   PATIENT EDUCATION:  Education details:  PT educated pt concerning post op and protocol restrictions and limitations.  PT answered questions.  Relevant anatomy, dx, POC, HEP, and exercise form/rationale. Person educated: Patient Education method: Explanation, Demonstration, Tactile cues, Verbal cues Education comprehension: verbalized understanding, returned demonstration, verbal cues required, tactile cues required, and needs further education  HOME EXERCISE PROGRAM: [id number]   ASSESSMENT:  CLINICAL IMPRESSION: Patient is 1 week and 4 days post op and doing well at this time.  Pt performed hand and wrist AROM well without c/o's with forearm in neutral position.  PT performed elbow flex/ext PROM w/n pt and tissue tolerance w/n protocol limits and pt  tolerated PROM well.  He had no pain with elbow PROM.  PT answered pt's questions and pt demonstrates good understanding.  Pt responded well to Rx having no pain and no c/o's after Rx.  He should benefit from skilled PT services per protocol and MD orders to address goals and impairments and to improve overall function.    OBJECTIVE IMPAIRMENTS: decreased activity tolerance, decreased ROM, decreased strength, increased edema, increased muscle spasms, impaired flexibility, impaired UE functional use, improper body mechanics, and pain.   ACTIVITY LIMITATIONS: carrying, lifting, bathing, dressing, and hygiene/grooming  PARTICIPATION LIMITATIONS: meal prep, cleaning, laundry, driving, shopping, community activity, occupation, and yard work  PERSONAL FACTORS:  none  are also affecting patient's functional outcome.   REHAB POTENTIAL: Good  CLINICAL DECISION MAKING: Stable/uncomplicated  EVALUATION COMPLEXITY: Low   GOALS: Goals reviewed with patient? Yes  SHORT TERM GOALS: Target date: 6/28  Elbow ext 0 deg Baseline: Goal status: INITIAL    LONG TERM GOALS: Target date: POC date  Meet FOTO goal Baseline:  Goal status: INITIAL  2.  Completing all ADLs without elbow pain Baseline:  Goal status: INITIAL  3.  Progressing gym program with proper form Baseline:  Goal status: INITIAL  4.  Returning to work with understanding of limitations and lifting form Baseline:  Goal status: INITIAL  5.  Biceps strength 85% opposite UE via hand held dynamometry Baseline:  Goal status: INITIAL  PLAN:  PT FREQUENCY: 1-2x/week  PT DURATION: 12 weeks  PLANNED INTERVENTIONS: Therapeutic exercises, Therapeutic activity, Neuromuscular re-education, Patient/Family education, Self Care, Joint mobilization, Dry Needling, Electrical stimulation, Spinal mobilization, Cryotherapy, Moist heat, Taping, Traction, Ultrasound, Ionotophoresis 4mg /ml Dexamethasone, Manual therapy, and  Re-evaluation.  PLAN FOR NEXT SESSION: NWB, progress ROM.  Cont per protocol.   Audie Clear III PT, DPT 07/22/22 4:48 PM

## 2022-07-22 ENCOUNTER — Ambulatory Visit (HOSPITAL_BASED_OUTPATIENT_CLINIC_OR_DEPARTMENT_OTHER): Payer: 59 | Admitting: Physical Therapy

## 2022-07-22 ENCOUNTER — Encounter (HOSPITAL_BASED_OUTPATIENT_CLINIC_OR_DEPARTMENT_OTHER): Payer: Self-pay | Admitting: Physical Therapy

## 2022-07-22 DIAGNOSIS — M25622 Stiffness of left elbow, not elsewhere classified: Secondary | ICD-10-CM

## 2022-07-22 DIAGNOSIS — S46219A Strain of muscle, fascia and tendon of other parts of biceps, unspecified arm, initial encounter: Secondary | ICD-10-CM | POA: Diagnosis not present

## 2022-07-22 DIAGNOSIS — M25522 Pain in left elbow: Secondary | ICD-10-CM

## 2022-07-25 ENCOUNTER — Ambulatory Visit (INDEPENDENT_AMBULATORY_CARE_PROVIDER_SITE_OTHER): Payer: 59 | Admitting: Orthopaedic Surgery

## 2022-07-25 DIAGNOSIS — S46219A Strain of muscle, fascia and tendon of other parts of biceps, unspecified arm, initial encounter: Secondary | ICD-10-CM

## 2022-07-25 DIAGNOSIS — M79602 Pain in left arm: Secondary | ICD-10-CM

## 2022-07-25 NOTE — Progress Notes (Signed)
Post Operative Evaluation    Procedure/Date of Surgery: Left distal biceps repair 5/30  Interval History:    Presents today status post left distal biceps repair.  Overall he is doing very well.  He is been progressing his range of motion.  He has been compliant with no heavy lifting.  Denies any bruising or swelling.  Denies any paresthesias or numbness.   PMH/PSH/Family History/Social History/Meds/Allergies:    Past Medical History:  Diagnosis Date   DDD (degenerative disc disease), lumbar    L4-5   HSV (herpes simplex virus) infection    Hypertension    Past Surgical History:  Procedure Laterality Date   DISTAL BICEPS TENDON REPAIR Left 07/11/2022   Procedure: LEFT ENDOSCOPIC ELBOW DISTAL BICEPS REPAIR;  Surgeon: Huel Cote, MD;  Location: [facility];  Service: Orthopedics;  Laterality: Left;   NECK SURGERY     2 ruptured disc, 2019, ACDF-    VASECTOMY     Social History   Socioeconomic History   Marital status: Single    Spouse name: Not on file   Number of children: Not on file   Years of education: Not on file   Highest education level: Not on file  Occupational History   Not on file  Tobacco Use   Smoking status: Some Days    Packs/day: 0.10    Years: 24.00    Additional pack years: 0.00    Total pack years: 2.40    Types: Cigarettes   Smokeless tobacco: Never  Substance and Sexual Activity   Alcohol use: Yes    Alcohol/week: 2.0 standard drinks of alcohol    Types: 2 Cans of beer per week   Drug use: No   Sexual activity: Never  Other Topics Concern   Not on file  Social History Narrative   Not on file   Social Determinants of Health   Financial Resource Strain: Not on file  Food Insecurity: Not on file  Transportation Needs: Not on file  Physical Activity: Not on file  Stress: Not on file  Social Connections: Not on file   Family History  Problem Relation Age of Onset   Cancer Father         Bladder   Heart disease Father    Cancer Mother        pancreatic   Heart disease Maternal Uncle    Heart disease Paternal Uncle    Allergies  Allergen Reactions   Elemental Sulfur Hives   Penicillins Hives    Was told when he was a kid he was allergic    Current Outpatient Medications  Medication Sig Dispense Refill   aspirin EC 81 MG tablet Take 81 mg by mouth daily.     hydrochlorothiazide (HYDRODIURIL) 25 MG tablet TAKE 1 TABLET (25 MG TOTAL) BY MOUTH DAILY. 90 tablet 3   lisinopril (ZESTRIL) 40 MG tablet TAKE 1 TABLET BY MOUTH EVERY DAY 90 tablet 3   LORazepam (ATIVAN) 1 MG tablet Take 1 tablet (1 mg total) by mouth every 8 (eight) hours. 1 tablet 0   oxyCODONE (ROXICODONE) 5 MG immediate release tablet Take 1 tablet (5 mg total) by mouth every 4 (four) hours as needed for severe pain or breakthrough pain. 5 tablet 0   predniSONE (DELTASONE) 20 MG tablet 3 tabs poqday  1-2, 2 tabs poqday 3-4, 1 tab poqday 5-6 12 tablet 0   triamcinolone cream (KENALOG) 0.1 % Apply 1 Application topically 2 (two) times daily. 80 g 2   valACYclovir (VALTREX) 500 MG tablet TAKE 1 TABLET (500 MG TOTAL) BY MOUTH DAILY. 90 tablet 3   No current facility-administered medications for this visit.   No results found.  Review of Systems:   A ROS was performed including pertinent positives and negatives as documented in the HPI.   Musculoskeletal Exam:    There were no vitals taken for this visit.  Left elbow range of motion is from 0 to 120 degrees.  There is pro supination which is full.  No bruising or swelling.  Strongly fires left wrist extensors.  Imaging:      I personally reviewed and interpreted the radiographs.   Assessment:   2 weeks status post left endoscopic distal biceps repair overall doing extremely well.  At this time he will continue to progress range of motion.  I will ask him refrain from any lifting greater than 5 pounds for the next 4 weeks.  I will see him back in  4 weeks for reassessment  Plan :    -Return to clinic in 4 weeks for reassessment      I personally saw and evaluated the patient, and participated in the management and treatment plan.  Huel Cote, MD Attending Physician, Orthopedic Surgery  This document was dictated using Dragon voice recognition software. A reasonable attempt at proof reading has been made to minimize errors.

## 2022-07-29 ENCOUNTER — Ambulatory Visit (HOSPITAL_BASED_OUTPATIENT_CLINIC_OR_DEPARTMENT_OTHER): Payer: 59 | Admitting: Physical Therapy

## 2022-08-04 NOTE — Therapy (Signed)
OUTPATIENT PHYSICAL THERAPY TREATMENT NOTE   Patient Name: Timothy Calderon MRN: [id number] DOB:01/31/1966, 57 y.o., male Today's Date: 08/05/2022  END OF SESSION:  PT End of Session - 08/05/22 0946     Visit Number 3    Number of Visits 12    Date for PT Re-Evaluation 07/27/22    PT Start Time 0945    PT Stop Time 1018    PT Time Calculation (min) 33 min    Activity Tolerance Patient tolerated treatment well;No increased pain    Behavior During Therapy WFL for tasks assessed/performed               Past Medical History:  Diagnosis Date   DDD (degenerative disc disease), lumbar    L4-5   HSV (herpes simplex virus) infection    Hypertension    Past Surgical History:  Procedure Laterality Date   DISTAL BICEPS TENDON REPAIR Left 07/11/2022   Procedure: LEFT ENDOSCOPIC ELBOW DISTAL BICEPS REPAIR;  Surgeon: Huel Cote, MD;  Location: [facility];  Service: Orthopedics;  Laterality: Left;   NECK SURGERY     2 ruptured disc, 2019, ACDF-    VASECTOMY     Patient Active Problem List   Diagnosis Date Noted   Rupture of distal biceps tendon, initial encounter 07/11/2022   Viral URI 06/13/2022   HNP (herniated nucleus pulposus), cervical 12/30/2015     REFERRING PROVIDER: Steward Drone, MD  REFERRING DIAG: S46.219A (ICD-10-CM) - Rupture of distal biceps tendon, initial encounter   Rationale for Evaluation and Treatment: Rehabilitation  THERAPY DIAG:  Pain in left elbow  Stiffness of left elbow, not elsewhere classified  ONSET DATE: Post op L distal biceps repair 07/11/2022   SUBJECTIVE:                                                                                                                                                                                           SUBJECTIVE STATEMENT: Pt is 3 weeks and 4 days s/p L endoscopic distal biceps repair.  Pt is doing well and his arm is feeling good.  Pt denies any increased pain and denies any  adverse effects after prior Rx.  Pt states he is able to do things now that he couldn't do prior.  He did some steering on his boat yesterday and was tender afterwards.  He states he has no pain this AM.  Pt states he has been careful with L UE.  Pt reports compliance with HEP.  Pt saw MD and he was pleased with his progress and was super excited about his ROM.  Pt states he is still  not to lift anything heavier than a cup of coffee/drink.  His incision is doing well.  Pt states he plans to return to work on Wednesday, but does not plan to use L UE.  PT instructed him to contact MD and ask him about returning to work.    PERTINENT HISTORY:  ACDF 2/3 or 3/4  PAIN:  Are you having pain? Yes: NPRS scale: 1/10 Pain location: Lt elbow Pain description: sore Aggravating factors: moving too quickly or lifting Relieving factors: rest  PRECAUTIONS:  None  WEIGHT BEARING RESTRICTIONS:  Yes NWB  FALLS:  Has patient fallen in last 6 months? No  OCCUPATION:  contractor  PLOF:  Independent  PATIENT GOALS:  Back to work and gym   OBJECTIVE:   HAND DOMINANCE:  Right    TREATMENT:                                                                                                                               -Reviewed pt response to prior Rx, pt presentation, and pain level.     -Pt performed wrist flexion/extension AROM in neutral position x 30 reps.   -Inspection:  No stitches are present.  Incisions are healing well, closed with normal pink color.  Pt has no signs of infection.     -Pt received L elbow flexion/extension PROM with forearm in neutral and forearm pronation/supination PROM with elbow at 90 deg of flexion per pt and tissue tolerance w/n protocol limits.    -See below for pt education.   PATIENT EDUCATION:  Education details:  PT educated pt concerning post op and protocol restrictions and limitations.  PT answered questions.  Relevant anatomy, dx, POC, HEP, and  exercise form/rationale.  PT instructed pt to contact MD concerning his returning to work and performing light duty and to get clearance from MD before doing so.  Pt agreed. Person educated: Patient Education method: Explanation, Demonstration, Verbal cues Education comprehension:  verbalized understanding, returned demonstration, verbal cues required  HOME EXERCISE PROGRAM: [id number]   ASSESSMENT:  CLINICAL IMPRESSION: Patient is 3 weeks and 4 days post op and is making excellent progress.  He has excellent elbow ROM at this time.  PT performed elbow and forearm PROM w/n pt and tissue tolerance w/n protocol limits and pt tolerated PROM well.  He had no c/o's and no pain with PROM.  Pt's incisions are closed and healing well.  PT answered pt's questions and pt demonstrates good understanding.  Pt responded well to Rx having no pain and no c/o's after Rx.  He should benefit from continued skilled PT services per protocol and MD orders to address goals and impairments and to improve overall function.    OBJECTIVE IMPAIRMENTS: decreased activity tolerance, decreased ROM, decreased strength, increased edema, increased muscle spasms, impaired flexibility, impaired UE functional use, improper body mechanics, and pain.   ACTIVITY LIMITATIONS: carrying, lifting, bathing, dressing, and hygiene/grooming  PARTICIPATION LIMITATIONS: meal prep,  cleaning, laundry, driving, shopping, community activity, occupation, and yard work  PERSONAL FACTORS:  none  are also affecting patient's functional outcome.   REHAB POTENTIAL: Good  CLINICAL DECISION MAKING: Stable/uncomplicated  EVALUATION COMPLEXITY: Low   GOALS: Goals reviewed with patient? Yes  SHORT TERM GOALS: Target date: 6/28  Elbow ext 0 deg Baseline: Goal status: INITIAL    LONG TERM GOALS: Target date: POC date  Meet FOTO goal Baseline:  Goal status: INITIAL  2.  Completing all ADLs without elbow pain Baseline:  Goal status:  INITIAL  3.  Progressing gym program with proper form Baseline:  Goal status: INITIAL  4.  Returning to work with understanding of limitations and lifting form Baseline:  Goal status: INITIAL  5.  Biceps strength 85% opposite UE via hand held dynamometry Baseline:  Goal status: INITIAL   PLAN:  PT FREQUENCY: 1-2x/week  PT DURATION: 12 weeks  PLANNED INTERVENTIONS: Therapeutic exercises, Therapeutic activity, Neuromuscular re-education, Patient/Family education, Self Care, Joint mobilization, Dry Needling, Electrical stimulation, Spinal mobilization, Cryotherapy, Moist heat, Taping, Traction, Ultrasound, Ionotophoresis 4mg /ml Dexamethasone, Manual therapy, and Re-evaluation.  PLAN FOR NEXT SESSION: NWB, progress ROM.  Cont per protocol.   Audie Clear III PT, DPT 08/05/22 11:59 AM

## 2022-08-05 ENCOUNTER — Encounter (HOSPITAL_BASED_OUTPATIENT_CLINIC_OR_DEPARTMENT_OTHER): Payer: Self-pay | Admitting: Physical Therapy

## 2022-08-05 ENCOUNTER — Ambulatory Visit (HOSPITAL_BASED_OUTPATIENT_CLINIC_OR_DEPARTMENT_OTHER): Payer: 59 | Admitting: Physical Therapy

## 2022-08-05 ENCOUNTER — Encounter (HOSPITAL_BASED_OUTPATIENT_CLINIC_OR_DEPARTMENT_OTHER): Payer: Self-pay | Admitting: Orthopaedic Surgery

## 2022-08-05 DIAGNOSIS — M25622 Stiffness of left elbow, not elsewhere classified: Secondary | ICD-10-CM

## 2022-08-05 DIAGNOSIS — M25522 Pain in left elbow: Secondary | ICD-10-CM

## 2022-08-05 DIAGNOSIS — S46219A Strain of muscle, fascia and tendon of other parts of biceps, unspecified arm, initial encounter: Secondary | ICD-10-CM | POA: Diagnosis not present

## 2022-08-11 NOTE — Therapy (Signed)
OUTPATIENT PHYSICAL THERAPY TREATMENT NOTE   Patient Name: Timothy Calderon MRN: [id number] DOB:07/12/1965, 57 y.o., male Today's Date: 08/12/2022  END OF SESSION:  PT End of Session - 08/12/22 0949     Visit Number 4    Number of Visits 12    Date for PT Re-Evaluation 07/27/22    PT Start Time 0946    PT Stop Time 1026    PT Time Calculation (min) 40 min    Activity Tolerance Patient tolerated treatment well    Behavior During Therapy WFL for tasks assessed/performed                Past Medical History:  Diagnosis Date   DDD (degenerative disc disease), lumbar    L4-5   HSV (herpes simplex virus) infection    Hypertension    Past Surgical History:  Procedure Laterality Date   DISTAL BICEPS TENDON REPAIR Left 07/11/2022   Procedure: LEFT ENDOSCOPIC ELBOW DISTAL BICEPS REPAIR;  Surgeon: Huel Cote, MD;  Location: [facility];  Service: Orthopedics;  Laterality: Left;   NECK SURGERY     2 ruptured disc, 2019, ACDF-    VASECTOMY     Patient Active Problem List   Diagnosis Date Noted   Rupture of distal biceps tendon, initial encounter 07/11/2022   Viral URI 06/13/2022   HNP (herniated nucleus pulposus), cervical 12/30/2015     REFERRING PROVIDER: Steward Drone, MD  REFERRING DIAG: S46.219A (ICD-10-CM) - Rupture of distal biceps tendon, initial encounter   Rationale for Evaluation and Treatment: Rehabilitation  THERAPY DIAG:  Pain in left elbow  Stiffness of left elbow, not elsewhere classified  ONSET DATE: Post op L distal biceps repair 07/11/2022   SUBJECTIVE:                                                                                                                                                                                           SUBJECTIVE STATEMENT: Pt is 4 weeks and 4 days s/p L endoscopic distal biceps repair.  Pt states his arm is feeling good, too good.  Pt sent a message to MD about returning to light duty and MD allowed  pt to return for light duty with no lifting.  Pt had no increased pain and no adverse effects after prior Rx.  Pt states his arm was warmed up after prior Rx.  Pt is not having any pain.  He states he does have tightness 1st thing in AM.  Pt states the tightness goes away and his arm feels fine.  Pt denies any increased pain and denies any adverse effects after prior Rx.  PERTINENT HISTORY:  ACDF 2/3 or 3/4  PAIN:  Are you having pain? Yes: NPRS scale: 1/10 Pain location: Lt elbow Pain description: sore Aggravating factors: moving too quickly or lifting Relieving factors: rest  PRECAUTIONS:  None  WEIGHT BEARING RESTRICTIONS:  Yes NWB  FALLS:  Has patient fallen in last 6 months? No  OCCUPATION:  contractor  PLOF:  Independent  PATIENT GOALS:  Back to work and gym   OBJECTIVE:   HAND DOMINANCE:  Right    TREATMENT:                                                                                                                               -Reviewed pt response to prior Rx, pt presentation, and pain level.     -Inspection:  Incisions clean and dry.  Incision closed with normal pink color.  Pt has no signs of infection.   -Pt performed: -wrist flexion/extension AROM in neutral position x 30 reps -scap retractions seated with hands in lap 2x10 -active elbow extension in supine with therapist performing elbow flexion passively to return to starting position -Pt received a HEP handout and was educated in correct form and appropriate frequency.  Pt instructed to relax Ue's in lap with seated scap retractions and not tense his biceps.  PT instructed pt he should not have any pain, pulling, or tension in biceps.  -Pt received L shoulder shoulder flexion PROM per pt tissue and tolerance.     -Pt received L elbow flexion/extension PROM with forearm in neutral and forearm pronation/supination PROM with elbow at 90 deg of flexion per pt and tissue tolerance w/n protocol  limits.    -See below for pt education.   PATIENT EDUCATION:  Education details:  PT educated pt concerning post op and protocol restrictions and limitations.  PT instructed pt to not perform any lifting at work, movements to avoid, and to not apply weight through L UE.  PT answered questions.  Relevant anatomy, dx, POC, HEP, and exercise form/rationale.   Person educated: Patient Education method: Solicitor, Verbal cues Education comprehension:  verbalized understanding, returned demonstration, verbal cues required  HOME EXERCISE PROGRAM: [id number]  Updated HEP: - Seated Scapular Retraction  - 1 x daily - 7 x weekly - 2 sets - 10 reps  ASSESSMENT:  CLINICAL IMPRESSION: Patient is 4 weeks and 4 days post op and is making excellent progress.  He has excellent elbow ROM at this time.  PT performed elbow and forearm PROM w/n pt and tissue tolerance w/n protocol limits and pt tolerated PROM well.  PT also performed shoulder flexion PROM which pt tolerated well and had no pain.  He had no c/o's and no pain with PROM.  Pt has been cleared by MD to return to light duty with no lifting.  He returned to work last week and reports he was careful and only performed light duty.  He had no adverse effects with  work activities.  PT answered pt's questions and pt demonstrates good understanding.  Pt performed exercises well with cuing for correct form and had no pain with exercises.  Pt responded well to Rx having no pain and no c/o's after Rx.  He should benefit from continued skilled PT services per protocol and MD orders to address goals and impairments and to improve overall function.    OBJECTIVE IMPAIRMENTS: decreased activity tolerance, decreased ROM, decreased strength, increased edema, increased muscle spasms, impaired flexibility, impaired UE functional use, improper body mechanics, and pain.   ACTIVITY LIMITATIONS: carrying, lifting, bathing, dressing, and  hygiene/grooming  PARTICIPATION LIMITATIONS: meal prep, cleaning, laundry, driving, shopping, community activity, occupation, and yard work  PERSONAL FACTORS:  none  are also affecting patient's functional outcome.   REHAB POTENTIAL: Good  CLINICAL DECISION MAKING: Stable/uncomplicated  EVALUATION COMPLEXITY: Low   GOALS: Goals reviewed with patient? Yes  SHORT TERM GOALS: Target date: 6/28  Elbow ext 0 deg Baseline: Goal status: INITIAL    LONG TERM GOALS: Target date: POC date  Meet FOTO goal Baseline:  Goal status: INITIAL  2.  Completing all ADLs without elbow pain Baseline:  Goal status: INITIAL  3.  Progressing gym program with proper form Baseline:  Goal status: INITIAL  4.  Returning to work with understanding of limitations and lifting form Baseline:  Goal status: INITIAL  5.  Biceps strength 85% opposite UE via hand held dynamometry Baseline:  Goal status: INITIAL   PLAN:  PT FREQUENCY: 1-2x/week  PT DURATION: 12 weeks  PLANNED INTERVENTIONS: Therapeutic exercises, Therapeutic activity, Neuromuscular re-education, Patient/Family education, Self Care, Joint mobilization, Dry Needling, Electrical stimulation, Spinal mobilization, Cryotherapy, Moist heat, Taping, Traction, Ultrasound, Ionotophoresis 4mg /ml Dexamethasone, Manual therapy, and Re-evaluation.  PLAN FOR NEXT SESSION: NWB, progress ROM.  Cont per protocol.   Audie Clear III PT, DPT 08/12/22 10:56 AM

## 2022-08-12 ENCOUNTER — Encounter (HOSPITAL_BASED_OUTPATIENT_CLINIC_OR_DEPARTMENT_OTHER): Payer: Self-pay | Admitting: Physical Therapy

## 2022-08-12 ENCOUNTER — Ambulatory Visit (HOSPITAL_BASED_OUTPATIENT_CLINIC_OR_DEPARTMENT_OTHER): Payer: 59 | Attending: Orthopaedic Surgery | Admitting: Physical Therapy

## 2022-08-12 DIAGNOSIS — M6281 Muscle weakness (generalized): Secondary | ICD-10-CM | POA: Insufficient documentation

## 2022-08-12 DIAGNOSIS — M25622 Stiffness of left elbow, not elsewhere classified: Secondary | ICD-10-CM | POA: Insufficient documentation

## 2022-08-12 DIAGNOSIS — M25522 Pain in left elbow: Secondary | ICD-10-CM | POA: Diagnosis present

## 2022-08-12 NOTE — Addendum Note (Signed)
Addended by: Fredrich Romans on: 08/12/2022 02:46 PM   Modules accepted: Orders

## 2022-08-14 ENCOUNTER — Encounter (HOSPITAL_BASED_OUTPATIENT_CLINIC_OR_DEPARTMENT_OTHER): Payer: Self-pay | Admitting: Physical Therapy

## 2022-08-14 ENCOUNTER — Ambulatory Visit (HOSPITAL_BASED_OUTPATIENT_CLINIC_OR_DEPARTMENT_OTHER): Payer: 59 | Admitting: Physical Therapy

## 2022-08-14 DIAGNOSIS — M25522 Pain in left elbow: Secondary | ICD-10-CM

## 2022-08-14 DIAGNOSIS — M25622 Stiffness of left elbow, not elsewhere classified: Secondary | ICD-10-CM

## 2022-08-14 DIAGNOSIS — M6281 Muscle weakness (generalized): Secondary | ICD-10-CM

## 2022-08-14 NOTE — Therapy (Signed)
OUTPATIENT PHYSICAL THERAPY TREATMENT NOTE   Patient Name: Timothy Calderon MRN: [id number] DOB:1965/10/17, 57 y.o., male Today's Date: 08/15/2022  END OF SESSION:  PT End of Session - 08/14/22 0943     Visit Number 5    Number of Visits 12    Date for PT Re-Evaluation 11/06/22    PT Start Time 0941    PT Stop Time 1021    PT Time Calculation (min) 40 min    Activity Tolerance Patient tolerated treatment well;No increased pain    Behavior During Therapy WFL for tasks assessed/performed                Past Medical History:  Diagnosis Date   DDD (degenerative disc disease), lumbar    L4-5   HSV (herpes simplex virus) infection    Hypertension    Past Surgical History:  Procedure Laterality Date   DISTAL BICEPS TENDON REPAIR Left 07/11/2022   Procedure: LEFT ENDOSCOPIC ELBOW DISTAL BICEPS REPAIR;  Surgeon: Huel Cote, MD;  Location: [facility];  Service: Orthopedics;  Laterality: Left;   NECK SURGERY     2 ruptured disc, 2019, ACDF-    VASECTOMY     Patient Active Problem List   Diagnosis Date Noted   Rupture of distal biceps tendon, initial encounter 07/11/2022   Viral URI 06/13/2022   HNP (herniated nucleus pulposus), cervical 12/30/2015     REFERRING PROVIDER: Huel Cote MD  REFERRING DIAG: [phone] (ICD-10-CM) - Rupture of distal biceps tendon, initial encounter   Rationale for Evaluation and Treatment: Rehabilitation  THERAPY DIAG:  Pain in left elbow  Stiffness of left elbow, not elsewhere classified  Muscle weakness (generalized)  ONSET DATE: Post op L distal biceps repair 07/11/2022   SUBJECTIVE:                                                                                                                                                                                           SUBJECTIVE STATEMENT: Pt is 4 weeks and 6 days s/p L endoscopic distal biceps repair.  Pt denies pain currently.  Pt had no increased pain and  no adverse effects after prior Rx.  Pt states he picked up dog food the other day without thinking about it.  He states his arm was not ready for it.  He let go of it with L UE and carried it with his R UE.  Pt reports having no pain afterwards.     PERTINENT HISTORY:  ACDF 2/3 or 3/4  PAIN:  Are you having pain? Yes: NPRS scale: 0/10 Pain location: Lt elbow Pain description: sore Aggravating  factors: moving too quickly or lifting Relieving factors: rest  PRECAUTIONS:  None  WEIGHT BEARING RESTRICTIONS:  Yes NWB  FALLS:  Has patient fallen in last 6 months? No  OCCUPATION:  contractor  PLOF:  Independent  PATIENT GOALS:  Back to work and gym   OBJECTIVE:   HAND DOMINANCE:  Right    TREATMENT:                                                                                                                               -Reviewed pt response to prior Rx, pt presentation, and pain level.   FOTO:  Initial/Current:  4/44.  Goal of 56 at visit 19.  L elbow PROM: 12 - 129 deg  R elbow AROM:  4 - 145 deg    -Inspection:  Incisions clean and dry.  Incision closed with normal pink color.  Pt has no signs of infection.   -Pt performed: -wrist flexion/extension AROM in neutral position x 30 reps -scap retractions seated with hands in lap 2x10 -active elbow extension in supine with therapist performing elbow flexion passively to return to starting position  -Pt received L shoulder shoulder flexion PROM per pt tissue and tolerance.     -Pt received L elbow flexion/extension PROM with forearm in neutral and forearm pronation/supination PROM with elbow at 90 deg of flexion per pt and tissue tolerance w/n protocol limits.    -See below for pt education.   PATIENT EDUCATION:  Education details:  PT educated pt concerning post op and protocol restrictions and limitations.  PT instructed pt to not perform any lifting at work, movements to avoid, and to not apply weight  through L UE.  PT answered questions.  Relevant anatomy, dx, POC, HEP, and exercise form/rationale.   Person educated: Patient Education method: Explanation, Demonstration, Verbal cues Education comprehension:  verbalized understanding, returned demonstration, verbal cues required  HOME EXERCISE PROGRAM: [id number]   ASSESSMENT:  CLINICAL IMPRESSION: Patient is 4 weeks and 6 days s/p distal biceps tendon repair and is making excellent progress.  He has excellent elbow ROM at this time.  Pt tolerates PROM well without c/o's.  PT performed elbow and forearm PROM w/n pt and tissue tolerance w/n protocol limits and pt tolerated PROM well.  He is progressing appropriately with protocol.  pt performed exercises well without any c/o's.  Pt demonstrates clinically significant improvement in self perceived disability with FOTO score improving from 4 to 44.  Pt has been cleared by MD to return to light duty with no lifting.  Pt responded well to Rx having no pain and no c/o's after Rx.  He should benefit from continued skilled PT services per protocol and MD orders to address goals and impairments and to improve overall function.       OBJECTIVE IMPAIRMENTS: decreased activity tolerance, decreased ROM, decreased strength, increased edema, increased muscle spasms, impaired flexibility, impaired UE functional use, improper body mechanics, and pain.  ACTIVITY LIMITATIONS: carrying, lifting, bathing, dressing, and hygiene/grooming  PARTICIPATION LIMITATIONS: meal prep, cleaning, laundry, driving, shopping, community activity, occupation, and yard work  PERSONAL FACTORS:  none  are also affecting patient's functional outcome.   REHAB POTENTIAL: Good  CLINICAL DECISION MAKING: Stable/uncomplicated  EVALUATION COMPLEXITY: Low   GOALS: Goals reviewed with patient? Yes  SHORT TERM GOALS: Target date: 6/28  Elbow ext 0 deg Baseline: Goal status: PROGRESSING    LONG TERM GOALS: Target date:  11/06/2022  Meet FOTO goal Baseline:  Goal status: INITIAL  2.  Completing all ADLs without elbow pain Baseline:  Goal status: INITIAL  3.  Progressing gym program with proper form Baseline:  Goal status: INITIAL  4.  Returning to work with understanding of limitations and lifting form Baseline:  Goal status: INITIAL  5.  Biceps strength 85% opposite UE via hand held dynamometry Baseline:  Goal status: INITIAL   PLAN:  PT FREQUENCY: 1-2x/week  PT DURATION: 12 weeks  PLANNED INTERVENTIONS: Therapeutic exercises, Therapeutic activity, Neuromuscular re-education, Patient/Family education, Self Care, Joint mobilization, Dry Needling, Electrical stimulation, Spinal mobilization, Cryotherapy, Moist heat, Taping, Traction, Ultrasound, Ionotophoresis 4mg /ml Dexamethasone, Manual therapy, and Re-evaluation.  PLAN FOR NEXT SESSION: NWB, progress ROM.  Cont per protocol.   Audie Clear III PT, DPT 08/15/22 2:04 PM

## 2022-08-19 ENCOUNTER — Ambulatory Visit (HOSPITAL_BASED_OUTPATIENT_CLINIC_OR_DEPARTMENT_OTHER): Payer: 59 | Admitting: Physical Therapy

## 2022-08-21 ENCOUNTER — Ambulatory Visit (HOSPITAL_BASED_OUTPATIENT_CLINIC_OR_DEPARTMENT_OTHER): Payer: 59 | Admitting: Physical Therapy

## 2022-08-28 ENCOUNTER — Encounter (HOSPITAL_BASED_OUTPATIENT_CLINIC_OR_DEPARTMENT_OTHER): Payer: 59 | Admitting: Orthopaedic Surgery

## 2022-09-03 ENCOUNTER — Ambulatory Visit (HOSPITAL_BASED_OUTPATIENT_CLINIC_OR_DEPARTMENT_OTHER): Payer: 59 | Admitting: Physical Therapy

## 2022-09-03 DIAGNOSIS — M25522 Pain in left elbow: Secondary | ICD-10-CM | POA: Diagnosis not present

## 2022-09-03 DIAGNOSIS — M6281 Muscle weakness (generalized): Secondary | ICD-10-CM

## 2022-09-03 DIAGNOSIS — M25622 Stiffness of left elbow, not elsewhere classified: Secondary | ICD-10-CM

## 2022-09-03 NOTE — Therapy (Signed)
OUTPATIENT PHYSICAL THERAPY TREATMENT NOTE   Patient Name: Timothy Calderon MRN: [id number] DOB:[birth year], 57 y.o., male Today's Date: 09/04/2022  END OF SESSION:  PT End of Session - 09/03/22 1549     Visit Number 6    Number of Visits 12    Date for PT Re-Evaluation 11/06/22    PT Start Time 1537    PT Stop Time 1612    PT Time Calculation (min) 35 min    Activity Tolerance Patient tolerated treatment well;No increased pain    Behavior During Therapy WFL for tasks assessed/performed                 Past Medical History:  Diagnosis Date   DDD (degenerative disc disease), lumbar    L4-5   HSV (herpes simplex virus) infection    Hypertension    Past Surgical History:  Procedure Laterality Date   DISTAL BICEPS TENDON REPAIR Left 07/11/2022   Procedure: LEFT ENDOSCOPIC ELBOW DISTAL BICEPS REPAIR;  Surgeon: Huel Cote, MD;  Location: [facility];  Service: Orthopedics;  Laterality: Left;   NECK SURGERY     2 ruptured disc, 2019, ACDF-    VASECTOMY     Patient Active Problem List   Diagnosis Date Noted   Rupture of distal biceps tendon, initial encounter 07/11/2022   Viral URI 06/13/2022   HNP (herniated nucleus pulposus), cervical 12/30/2015     REFERRING PROVIDER: Huel Cote MD  REFERRING DIAG: [phone] (ICD-10-CM) - Rupture of distal biceps tendon, initial encounter   Rationale for Evaluation and Treatment: Rehabilitation  THERAPY DIAG:  Pain in left elbow  Stiffness of left elbow, not elsewhere classified  Muscle weakness (generalized)  ONSET DATE: Post op L distal biceps repair 07/11/2022   SUBJECTIVE:                                                                                                                                                                                           SUBJECTIVE STATEMENT: Pt is 7 weeks and 5 days s/p L endoscopic distal biceps repair.  Pt missed a couple of appointments due to work.  Pt  states he had some stiffness and soreness in the elbow after work though didn't last.  He woke up the next AM and felt fine.  Pt denies any pain in biceps with work activities.  Pt denies any pain and adverse effects after prior Rx.  Pt denies pain currently.     PERTINENT HISTORY:  ACDF 2/3 or 3/4  PAIN:  Are you having pain? Yes: NPRS scale: 0/10 Pain location: Lt elbow Pain description: sore Aggravating factors: moving  too quickly or lifting Relieving factors: rest  PRECAUTIONS:  None  WEIGHT BEARING RESTRICTIONS:  Yes NWB  FALLS:  Has patient fallen in last 6 months? No  OCCUPATION:  contractor  PLOF:  Independent  PATIENT GOALS:  Back to work and gym   OBJECTIVE:   HAND DOMINANCE:  Right    TREATMENT:                                                                                                                               -Reviewed pt response to prior Rx, pt presentation, and pain level.   -Pt performed: -elbow flexion/extension AROM in supine 2x10 in neutral and 1x10 in supination -supination/pronation seated with elbow flexed and forearm supported 2x10 -shoulder ER/IR isometrics with elbow flexed at wall x10 reps with 5 sec hold     -Pt received L elbow flexion/extension PROM in supine and forearm pronation/supination PROM with elbow at 90 deg of flexion in supine per pt and tissue tolerance w/n protocol limits.    -See below for pt education.     PATIENT EDUCATION:  Education details:  PT answered pt's questions. Progression of protocol.  PT educated pt concerning post op and protocol restrictions and limitations.  Relevant anatomy, POC, HEP, and exercise form/rationale.   Person educated: Patient Education method: Explanation, Demonstration, Verbal cues, tactile cues Education comprehension:  verbalized understanding, returned demonstration, verbal and tactile cues required  HOME EXERCISE PROGRAM: [id number]   ASSESSMENT:  CLINICAL  IMPRESSION: Patient continues to make excellent progress.  He has excellent elbow ROM.  PT progressed exercises per protocol with elbow and forearm active ROM and pt tolerated progression well.  PT educated pt in appropriate ROM including to not perform into tension or pain.  Pt performed exercises well without pain and without c/o's.  Pt also tolerated PROM well without c/o's.  He responded well to Rx having no pain after Rx and should benefit from continued skilled PT services per protocol and MD orders to address goals and impairments and to improve overall function.       OBJECTIVE IMPAIRMENTS: decreased activity tolerance, decreased ROM, decreased strength, increased edema, increased muscle spasms, impaired flexibility, impaired UE functional use, improper body mechanics, and pain.   ACTIVITY LIMITATIONS: carrying, lifting, bathing, dressing, and hygiene/grooming  PARTICIPATION LIMITATIONS: meal prep, cleaning, laundry, driving, shopping, community activity, occupation, and yard work  PERSONAL FACTORS:  none  are also affecting patient's functional outcome.   REHAB POTENTIAL: Good  CLINICAL DECISION MAKING: Stable/uncomplicated  EVALUATION COMPLEXITY: Low   GOALS: Goals reviewed with patient? Yes  SHORT TERM GOALS: Target date: 6/28  Elbow ext 0 deg Baseline: Goal status: PROGRESSING    LONG TERM GOALS: Target date: 11/06/2022  Meet FOTO goal Baseline:  Goal status: INITIAL  2.  Completing all ADLs without elbow pain Baseline:  Goal status: INITIAL  3.  Progressing gym program with proper form Baseline:  Goal status: INITIAL  4.  Returning to work with understanding of limitations and lifting form Baseline:  Goal status: INITIAL  5.  Biceps strength 85% opposite UE via hand held dynamometry Baseline:  Goal status: INITIAL   PLAN:  PT FREQUENCY: 1-2x/week  PT DURATION: 12 weeks  PLANNED INTERVENTIONS: Therapeutic exercises, Therapeutic activity,  Neuromuscular re-education, Patient/Family education, Self Care, Joint mobilization, Dry Needling, Electrical stimulation, Spinal mobilization, Cryotherapy, Moist heat, Taping, Traction, Ultrasound, Ionotophoresis 4mg /ml Dexamethasone, Manual therapy, and Re-evaluation.  PLAN FOR NEXT SESSION: NWB, progress ROM.  Cont per protocol.   Audie Clear III PT, DPT 09/04/22 9:57 PM

## 2022-09-04 ENCOUNTER — Encounter (HOSPITAL_BASED_OUTPATIENT_CLINIC_OR_DEPARTMENT_OTHER): Payer: Self-pay | Admitting: Physical Therapy

## 2022-09-09 ENCOUNTER — Ambulatory Visit (INDEPENDENT_AMBULATORY_CARE_PROVIDER_SITE_OTHER): Payer: 59 | Admitting: Orthopaedic Surgery

## 2022-09-09 DIAGNOSIS — S46219A Strain of muscle, fascia and tendon of other parts of biceps, unspecified arm, initial encounter: Secondary | ICD-10-CM

## 2022-09-09 NOTE — Progress Notes (Signed)
Post Operative Evaluation    Procedure/Date of Surgery: Left distal biceps repair 5/30  Interval History:    Presents today status post left distal biceps repair.  Overall he is doing quite well.  He has been progressing with physical therapy.  He is working on active range of motion.   PMH/PSH/Family History/Social History/Meds/Allergies:    Past Medical History:  Diagnosis Date   DDD (degenerative disc disease), lumbar    L4-5   HSV (herpes simplex virus) infection    Hypertension    Past Surgical History:  Procedure Laterality Date   DISTAL BICEPS TENDON REPAIR Left 07/11/2022   Procedure: LEFT ENDOSCOPIC ELBOW DISTAL BICEPS REPAIR;  Surgeon: Huel Cote, MD;  Location: [facility];  Service: Orthopedics;  Laterality: Left;   NECK SURGERY     2 ruptured disc, 2019, ACDF-    VASECTOMY     Social History   Socioeconomic History   Marital status: Single    Spouse name: Not on file   Number of children: Not on file   Years of education: Not on file   Highest education level: Not on file  Occupational History   Not on file  Tobacco Use   Smoking status: Some Days    Current packs/day: 0.10    Average packs/day: 0.1 packs/day for 24.0 years (2.4 ttl pk-yrs)    Types: Cigarettes   Smokeless tobacco: Never  Substance and Sexual Activity   Alcohol use: Yes    Alcohol/week: 2.0 standard drinks of alcohol    Types: 2 Cans of beer per week   Drug use: No   Sexual activity: Never  Other Topics Concern   Not on file  Social History Narrative   Not on file   Social Determinants of Health   Financial Resource Strain: Not on file  Food Insecurity: Not on file  Transportation Needs: Not on file  Physical Activity: Not on file  Stress: Not on file  Social Connections: Not on file   Family History  Problem Relation Age of Onset   Cancer Father        Bladder   Heart disease Father    Cancer Mother         pancreatic   Heart disease Maternal Uncle    Heart disease Paternal Uncle    Allergies  Allergen Reactions   Elemental Sulfur Hives   Penicillins Hives    Was told when he was a kid he was allergic    Current Outpatient Medications  Medication Sig Dispense Refill   aspirin EC 81 MG tablet Take 81 mg by mouth daily.     hydrochlorothiazide (HYDRODIURIL) 25 MG tablet TAKE 1 TABLET (25 MG TOTAL) BY MOUTH DAILY. 90 tablet 3   lisinopril (ZESTRIL) 40 MG tablet TAKE 1 TABLET BY MOUTH EVERY DAY 90 tablet 3   LORazepam (ATIVAN) 1 MG tablet Take 1 tablet (1 mg total) by mouth every 8 (eight) hours. (Patient not taking: Reported on 08/12/2022) 1 tablet 0   oxyCODONE (ROXICODONE) 5 MG immediate release tablet Take 1 tablet (5 mg total) by mouth every 4 (four) hours as needed for severe pain or breakthrough pain. (Patient not taking: Reported on 08/12/2022) 5 tablet 0   predniSONE (DELTASONE) 20 MG tablet 3 tabs poqday 1-2, 2 tabs poqday 3-4, 1  tab poqday 5-6 (Patient not taking: Reported on 08/12/2022) 12 tablet 0   triamcinolone cream (KENALOG) 0.1 % Apply 1 Application topically 2 (two) times daily. 80 g 2   valACYclovir (VALTREX) 500 MG tablet TAKE 1 TABLET (500 MG TOTAL) BY MOUTH DAILY. 90 tablet 3   No current facility-administered medications for this visit.   No results found.  Review of Systems:   A ROS was performed including pertinent positives and negatives as documented in the HPI.   Musculoskeletal Exam:    There were no vitals taken for this visit.  Left elbow range of motion is from 0 to 120 degrees.  There is pro supination which is full.  No bruising or swelling.  Strongly fires left wrist extensors.  There is some decrease biceps bulk  Imaging:      I personally reviewed and interpreted the radiographs.   Assessment:   6 weeks status post left endoscopic distal biceps repair overall doing extremely well.  He does have some decreased biceps bulk today.  At this time he may  begin gentle weight assisted active range of motion.  I have advised that he start extremely light with only 5 pounds.  We did also discuss blood flow restriction  Plan :    -Return to clinic in 6 weeks for reassessment      I personally saw and evaluated the patient, and participated in the management and treatment plan.  Huel Cote, MD Attending Physician, Orthopedic Surgery  This document was dictated using Dragon voice recognition software. A reasonable attempt at proof reading has been made to minimize errors.

## 2022-09-13 ENCOUNTER — Encounter (HOSPITAL_BASED_OUTPATIENT_CLINIC_OR_DEPARTMENT_OTHER): Payer: Self-pay | Admitting: Physical Therapy

## 2022-09-15 NOTE — Therapy (Addendum)
 OUTPATIENT PHYSICAL THERAPY TREATMENT NOTE   Patient Name: Timothy Calderon MRN: [id number] DOB:08/16/1965, 57 y.o., male Today's Date: 09/16/2022  END OF SESSION:  PT End of Session - 09/16/22 1152     Visit Number 7    Number of Visits 12    Date for PT Re-Evaluation 11/06/22    PT Start Time 1147    PT Stop Time 1232    PT Time Calculation (min) 45 min    Activity Tolerance Patient tolerated treatment well;No increased pain    Behavior During Therapy WFL for tasks assessed/performed                  Past Medical History:  Diagnosis Date   DDD (degenerative disc disease), lumbar    L4-5   HSV (herpes simplex virus) infection    Hypertension    Past Surgical History:  Procedure Laterality Date   DISTAL BICEPS TENDON REPAIR Left 07/11/2022   Procedure: LEFT ENDOSCOPIC ELBOW DISTAL BICEPS REPAIR;  Surgeon: Genelle Standing, MD;  Location: [facility];  Service: Orthopedics;  Laterality: Left;   NECK SURGERY     2 ruptured disc, 2019, ACDF-    VASECTOMY     Patient Active Problem List   Diagnosis Date Noted   Rupture of distal biceps tendon, initial encounter 07/11/2022   Viral URI 06/13/2022   HNP (herniated nucleus pulposus), cervical 12/30/2015     REFERRING PROVIDER: Genelle Standing MD  REFERRING DIAG: [phone] (ICD-10-CM) - Rupture of distal biceps tendon, initial encounter   Rationale for Evaluation and Treatment: Rehabilitation  THERAPY DIAG:  Pain in left elbow  Stiffness of left elbow, not elsewhere classified  Muscle weakness (generalized)  ONSET DATE: Post op L distal biceps repair 07/11/2022   SUBJECTIVE:                                                                                                                                                                                           SUBJECTIVE STATEMENT: Pt is 9 weeks and 4 days s/p L endoscopic distal biceps repair.  Pt denies any pain and adverse effects after prior  Rx.  Pt denies pain currently. Pt saw MD last week and he was very pleased with the pt's progress.  MD instructed him he could perform biceps curls with resistance now, but no > than 5 lbs.  MD note indicated he discussed blood flow restriction with patient.  Pt states MD showed him what to order and he ordered a BFR strap.  Pt has been performing BFR at home with a 5# weight.  He states it is a good workout  and he feels great afterwards.  He denies any pain and soreness after with BFR.  Pt states he rode his jet skis twice without any adverse effects.      PERTINENT HISTORY:  ACDF 2/3 or 3/4  PAIN:  Are you having pain? Yes: NPRS scale: 0/10 Pain location: Lt elbow/biceps  Pain description: sore Aggravating factors: moving too quickly or lifting Relieving factors: rest  PRECAUTIONS:  None  WEIGHT BEARING RESTRICTIONS:  Yes NWB  FALLS:  Has patient fallen in last 6 months? No  OCCUPATION:  contractor  PLOF:  Independent  PATIENT GOALS:  Back to work and gym   OBJECTIVE:   HAND DOMINANCE:  Right    TREATMENT:                                                                                                                               -Reviewed pt response to prior Rx, pt presentation, and pain level.   -Pt performed: -elbow flexion/extension AROM in standing x10 in neutral and 2x10 in supination -supine serratus punch 3x10 -supination/pronation seated with elbow flexed and forearm supported 2x10 -supination/pronation submaximal isometrics in neutral x 10 reps each with 5 sec hold with manual resistance with elbow flexed and supported -shoulder submaximal ER, IR, and abd isometrics with elbow flexed at wall x10 reps each with 5 sec hold      -Pt received L elbow flexion/extension PROM in supine and in supine per pt and tissue tolerance w/n protocol limits.     -Pt received a HEP handout and was educated in correct form and appropriate frequency.  PT instructed pt in  correct positioning and form and appropriate frequency and that he should not have pain with HEP.   -See below for pt education.    PATIENT EDUCATION:  Education details:  Educated pt with appropriate resistance for BFR and also that he should not have any pain with BFR.  PT answered pt's questions. Progression of protocol and protocol restrictions.  Relevant anatomy, POC, HEP, and exercise form/rationale.   Person educated: Patient Education method: Explanation, Demonstration, Verbal cues, tactile cues Education comprehension:  verbalized understanding, returned demonstration, verbal and tactile cues required  HOME EXERCISE PROGRAM: [id number]  Updated HEP: - Isometric Shoulder External Rotation at Wall  - 1 x daily - 5 x weekly - 1-2 sets - 10 reps - Standing Isometric Shoulder Internal Rotation at Doorway  - 1 x daily - 5 x weekly - 1-2 sets - 10 reps - 5 seconds hold - Isometric Shoulder Abduction at Wall  - 1 x daily - 5 x weekly - 1-2 sets - 10 reps - 5 seconds hold - Seated Forearm Pronation and Supination AROM  - 1-2 x daily - 7 x weekly - 2 sets - 10 reps - Supine Single Arm Shoulder Protraction  - 1 x daily - 6-7 x weekly - 2-3 sets - 10 reps   ASSESSMENT:  CLINICAL IMPRESSION:  Patient recently saw MD who is allowing him to perform resistance with biceps curl, but no greater than 5 lbs.  MD discussed BFR with pt and he has been performing at home without any pain or soreness.  He continues to make excellent progress.  He has excellent elbow ROM and tolerated PROM well without any c/o's.  PT progressed exercises per protocol and he tolerated progression well.  He performed exercises well without any pain.  PT updated HEP and pt demonstrates good understanding.  He responded well to Rx having no pain after Rx stating his arm felt good.  Pt should benefit from continued skilled PT services per protocol and MD orders to address goals and impairments and to improve overall function.        OBJECTIVE IMPAIRMENTS: decreased activity tolerance, decreased ROM, decreased strength, increased edema, increased muscle spasms, impaired flexibility, impaired UE functional use, improper body mechanics, and pain.   ACTIVITY LIMITATIONS: carrying, lifting, bathing, dressing, and hygiene/grooming  PARTICIPATION LIMITATIONS: meal prep, cleaning, laundry, driving, shopping, community activity, occupation, and yard work  PERSONAL FACTORS: none are also affecting patient's functional outcome.   REHAB POTENTIAL: Good  CLINICAL DECISION MAKING: Stable/uncomplicated  EVALUATION COMPLEXITY: Low   GOALS: Goals reviewed with patient? Yes  SHORT TERM GOALS: Target date: 6/28  Elbow ext 0 deg Baseline: Goal status: PROGRESSING    LONG TERM GOALS: Target date: 11/06/2022  Meet FOTO goal Baseline:  Goal status: INITIAL  2.  Completing all ADLs without elbow pain Baseline:  Goal status: INITIAL  3.  Progressing gym program with proper form Baseline:  Goal status: INITIAL  4.  Returning to work with understanding of limitations and lifting form Baseline:  Goal status: INITIAL  5.  Biceps strength 85% opposite UE via hand held dynamometry Baseline:  Goal status: INITIAL   PLAN:  PT FREQUENCY: 1-2x/week  PT DURATION: 12 weeks  PLANNED INTERVENTIONS: Therapeutic exercises, Therapeutic activity, Neuromuscular re-education, Patient/Family education, Self Care, Joint mobilization, Dry Needling, Electrical stimulation, Spinal mobilization, Cryotherapy, Moist heat, Taping, Traction, Ultrasound, Ionotophoresis 4mg /ml Dexamethasone , Manual therapy, and Re-evaluation.  PLAN FOR NEXT SESSION:  Cont per distal biceps tendon repair protocol and MD orders.   Leigh Minerva III PT, DPT 09/16/22 12:51 PM PHYSICAL THERAPY DISCHARGE SUMMARY  Visits from Start of Care: 7  Current functional level related to goals / functional outcomes: Pt was making excellent progress.  Unable to  assess current function and goals due to pt not being present at discharge.    Remaining deficits: Unable to assess current function and goals due to pt not being present at discharge.    Education / Equipment: See above   Patient was seen in PT from 07/15/22 to 09/16/22.  He No-showed his following appt on 10/07/22 and did not reschedule.  Pt will be discharged from skilled PT due to not scheduling any further PT.    Leigh Minerva III PT, DPT 08/25/23 8:34 AM

## 2022-09-16 ENCOUNTER — Encounter (HOSPITAL_BASED_OUTPATIENT_CLINIC_OR_DEPARTMENT_OTHER): Payer: Self-pay | Admitting: Physical Therapy

## 2022-09-16 ENCOUNTER — Ambulatory Visit (HOSPITAL_BASED_OUTPATIENT_CLINIC_OR_DEPARTMENT_OTHER): Payer: 59 | Attending: Orthopaedic Surgery | Admitting: Physical Therapy

## 2022-09-16 DIAGNOSIS — M6281 Muscle weakness (generalized): Secondary | ICD-10-CM | POA: Diagnosis present

## 2022-09-16 DIAGNOSIS — M25622 Stiffness of left elbow, not elsewhere classified: Secondary | ICD-10-CM | POA: Insufficient documentation

## 2022-09-16 DIAGNOSIS — M25522 Pain in left elbow: Secondary | ICD-10-CM | POA: Insufficient documentation

## 2022-10-04 ENCOUNTER — Ambulatory Visit: Payer: 59 | Admitting: Family Medicine

## 2022-10-07 ENCOUNTER — Ambulatory Visit: Payer: 59 | Admitting: Family Medicine

## 2022-10-07 ENCOUNTER — Ambulatory Visit (HOSPITAL_BASED_OUTPATIENT_CLINIC_OR_DEPARTMENT_OTHER): Payer: 59 | Admitting: Physical Therapy

## 2022-10-07 VITALS — BP 160/92 | HR 74 | Temp 98.7°F | Ht 70.0 in | Wt 216.0 lb

## 2022-10-07 DIAGNOSIS — R5383 Other fatigue: Secondary | ICD-10-CM | POA: Diagnosis not present

## 2022-10-07 DIAGNOSIS — I10 Essential (primary) hypertension: Secondary | ICD-10-CM | POA: Diagnosis not present

## 2022-10-07 MED ORDER — AMLODIPINE BESYLATE 10 MG PO TABS
10.0000 mg | ORAL_TABLET | Freq: Every day | ORAL | 3 refills | Status: DC
Start: 2022-10-07 — End: 2023-10-22

## 2022-10-07 NOTE — Progress Notes (Signed)
Subjective:    Patient ID: Timothy Calderon, male    DOB: 12/24/1965, 57 y.o.   MRN: [id number]  HPI BP 150's - 160's/90's x 2 weeks.  Having headaches.  Under more stress.  Reports fatigue.  Using testosterone he gets from a non medical source.  Denies chest pain, sob doe.   Past Medical History:  Diagnosis Date   DDD (degenerative disc disease), lumbar    L4-5   HSV (herpes simplex virus) infection    Hypertension    Past Surgical History:  Procedure Laterality Date   DISTAL BICEPS TENDON REPAIR Left 07/11/2022   Procedure: LEFT ENDOSCOPIC ELBOW DISTAL BICEPS REPAIR;  Surgeon: Huel Cote, MD;  Location: [facility];  Service: Orthopedics;  Laterality: Left;   NECK SURGERY     2 ruptured disc, 2019, ACDF-    VASECTOMY     Current Outpatient Medications on File Prior to Visit  Medication Sig Dispense Refill   aspirin EC 81 MG tablet Take 81 mg by mouth daily.     hydrochlorothiazide (HYDRODIURIL) 25 MG tablet TAKE 1 TABLET (25 MG TOTAL) BY MOUTH DAILY. 90 tablet 3   lisinopril (ZESTRIL) 40 MG tablet TAKE 1 TABLET BY MOUTH EVERY DAY 90 tablet 3   LORazepam (ATIVAN) 1 MG tablet Take 1 tablet (1 mg total) by mouth every 8 (eight) hours. (Patient not taking: Reported on 08/12/2022) 1 tablet 0   oxyCODONE (ROXICODONE) 5 MG immediate release tablet Take 1 tablet (5 mg total) by mouth every 4 (four) hours as needed for severe pain or breakthrough pain. (Patient not taking: Reported on 08/12/2022) 5 tablet 0   predniSONE (DELTASONE) 20 MG tablet 3 tabs poqday 1-2, 2 tabs poqday 3-4, 1 tab poqday 5-6 (Patient not taking: Reported on 08/12/2022) 12 tablet 0   triamcinolone cream (KENALOG) 0.1 % Apply 1 Application topically 2 (two) times daily. 80 g 2   valACYclovir (VALTREX) 500 MG tablet TAKE 1 TABLET (500 MG TOTAL) BY MOUTH DAILY. 90 tablet 3   No current facility-administered medications on file prior to visit.   Allergies  Allergen Reactions   Elemental Sulfur Hives    Penicillins Hives    Was told when he was a kid he was allergic    Social History   Socioeconomic History   Marital status: Single    Spouse name: Not on file   Number of children: Not on file   Years of education: Not on file   Highest education level: Not on file  Occupational History   Not on file  Tobacco Use   Smoking status: Some Days    Current packs/day: 0.10    Average packs/day: 0.1 packs/day for 24.0 years (2.4 ttl pk-yrs)    Types: Cigarettes   Smokeless tobacco: Never  Substance and Sexual Activity   Alcohol use: Yes    Alcohol/week: 2.0 standard drinks of alcohol    Types: 2 Cans of beer per week   Drug use: No   Sexual activity: Never  Other Topics Concern   Not on file  Social History Narrative   Not on file   Social Determinants of Health   Financial Resource Strain: Not on file  Food Insecurity: Not on file  Transportation Needs: Not on file  Physical Activity: Not on file  Stress: Not on file  Social Connections: Not on file  Intimate Partner Violence: Not on file   Family History  Problem Relation Age of Onset   Cancer Father  Bladder   Heart disease Father    Cancer Mother        pancreatic   Heart disease Maternal Uncle    Heart disease Paternal Uncle       Review of Systems  All other systems reviewed and are negative.      Objective:   Physical Exam Vitals reviewed.  Constitutional:      General: He is not in acute distress.    Appearance: He is normal weight. He is not ill-appearing, toxic-appearing or diaphoretic.  HENT:     Head: Normocephalic and atraumatic.     Right Ear: Tympanic membrane, ear canal and external ear normal. There is no impacted cerumen.     Left Ear: Tympanic membrane, ear canal and external ear normal. There is no impacted cerumen.     Nose: Nose normal. No congestion or rhinorrhea.     Mouth/Throat:     Mouth: Mucous membranes are moist.     Pharynx: No oropharyngeal exudate or posterior  oropharyngeal erythema.  Eyes:     General: No scleral icterus.       Right eye: No discharge.        Left eye: No discharge.     Extraocular Movements: Extraocular movements intact.     Conjunctiva/sclera: Conjunctivae normal.     Pupils: Pupils are equal, round, and reactive to light.  Neck:     Vascular: No carotid bruit.  Cardiovascular:     Rate and Rhythm: Normal rate and regular rhythm.     Pulses: Normal pulses.     Heart sounds: Normal heart sounds. No murmur heard.    No friction rub.  Pulmonary:     Effort: Pulmonary effort is normal. No respiratory distress.     Breath sounds: Normal breath sounds. No stridor. No wheezing, rhonchi or rales.  Chest:     Chest wall: No tenderness.  Abdominal:     General: Abdomen is flat. There is no distension.     Palpations: Abdomen is soft. There is no mass.     Tenderness: There is no abdominal tenderness. There is no right CVA tenderness, left CVA tenderness, guarding or rebound.     Hernia: No hernia is present.  Musculoskeletal:     Cervical back: Normal range of motion and neck supple. No rigidity or tenderness.     Right lower leg: No edema.     Left lower leg: No edema.  Lymphadenopathy:     Cervical: No cervical adenopathy.  Skin:    General: Skin is warm.     Coloration: Skin is not jaundiced or pale.     Findings: No bruising, erythema, lesion or rash.  Neurological:     General: No focal deficit present.     Mental Status: He is alert and oriented to person, place, and time. Mental status is at baseline.     Cranial Nerves: No cranial nerve deficit.     Sensory: No sensory deficit.     Motor: No weakness.     Coordination: Coordination normal.     Gait: Gait normal.     Deep Tendon Reflexes: Reflexes normal.  Psychiatric:        Mood and Affect: Mood normal.        Behavior: Behavior normal.        Thought Content: Thought content normal.        Judgment: Judgment normal.    Ruddy appearance        Assessment &  Plan:  Fatigue, unspecified type - Plan: CBC with Differential/Platelet, COMPLETE METABOLIC PANEL WITH GFR, Testosterone Total,Free,Bio, Males  Benign essential HTN Add amlodipine 10 mg poqday to lisinopril and hydrochlorothiazide.  Recheck in 2 weeks.  Check labs to monitor for polycythemia.

## 2022-10-08 LAB — COMPLETE METABOLIC PANEL WITH GFR
AG Ratio: 1.9 (calc) (ref 1.0–2.5)
ALT: 25 U/L (ref 9–46)
AST: 20 U/L (ref 10–35)
Albumin: 4.5 g/dL (ref 3.6–5.1)
Alkaline phosphatase (APISO): 104 U/L (ref 35–144)
BUN: 14 mg/dL (ref 7–25)
CO2: 29 mmol/L (ref 20–32)
Calcium: 9.8 mg/dL (ref 8.6–10.3)
Chloride: 90 mmol/L — ABNORMAL LOW (ref 98–110)
Creat: 1.17 mg/dL (ref 0.70–1.30)
Globulin: 2.4 g/dL (ref 1.9–3.7)
Glucose, Bld: 86 mg/dL (ref 65–99)
Potassium: 4.6 mmol/L (ref 3.5–5.3)
Sodium: 127 mmol/L — ABNORMAL LOW (ref 135–146)
Total Bilirubin: 1.2 mg/dL (ref 0.2–1.2)
Total Protein: 6.9 g/dL (ref 6.1–8.1)
eGFR: 74 mL/min/{1.73_m2} (ref >=60)

## 2022-10-08 LAB — CBC WITH DIFFERENTIAL/PLATELET
Absolute Monocytes: 564 {cells}/uL (ref 200–950)
Basophils Absolute: 32 {cells}/uL (ref 0–200)
Basophils Relative: 0.8 %
Eosinophils Absolute: 32 {cells}/uL (ref 15–500)
Eosinophils Relative: 0.8 %
HCT: 49.2 % (ref 38.5–50.0)
Hemoglobin: 17.2 g/dL — ABNORMAL HIGH (ref 13.2–17.1)
Lymphs Abs: 1008 {cells}/uL (ref 850–3900)
MCH: 33.2 pg — ABNORMAL HIGH (ref 27.0–33.0)
MCHC: 35 g/dL (ref 32.0–36.0)
MCV: 95 fL (ref 80.0–100.0)
MPV: 11.5 fL (ref 7.5–12.5)
Monocytes Relative: 14.1 %
Neutro Abs: 2364 {cells}/uL (ref 1500–7800)
Neutrophils Relative %: 59.1 %
Platelets: 170 10*3/uL (ref 140–400)
RBC: 5.18 10*6/uL (ref 4.20–5.80)
RDW: 12.2 % (ref 11.0–15.0)
Total Lymphocyte: 25.2 %
WBC: 4 10*3/uL (ref 3.8–10.8)

## 2022-10-08 LAB — TESTOSTERONE TOTAL,FREE,BIO, MALES
Albumin: 4.5 g/dL (ref 3.6–5.1)
Sex Hormone Binding: 48 nmol/L (ref 10–50)
Testosterone: 119 ng/dL — ABNORMAL LOW (ref 250–827)

## 2022-10-10 ENCOUNTER — Other Ambulatory Visit: Payer: Self-pay

## 2022-10-10 DIAGNOSIS — I10 Essential (primary) hypertension: Secondary | ICD-10-CM

## 2022-10-10 MED ORDER — NEBIVOLOL HCL 10 MG PO TABS: 10.0000 mg | ORAL_TABLET | Freq: Every day | ORAL | 3 refills | Status: DC

## 2022-10-17 ENCOUNTER — Other Ambulatory Visit: Payer: 59

## 2022-10-17 DIAGNOSIS — I10 Essential (primary) hypertension: Secondary | ICD-10-CM

## 2022-10-18 LAB — BASIC METABOLIC PANEL
BUN: 14 mg/dL (ref 7–25)
CO2: 28 mmol/L (ref 20–32)
Calcium: 9.6 mg/dL (ref 8.6–10.3)
Chloride: 98 mmol/L (ref 98–110)
Creat: 0.98 mg/dL (ref 0.70–1.30)
Glucose, Bld: 78 mg/dL (ref 65–99)
Potassium: 3.7 mmol/L (ref 3.5–5.3)
Sodium: 135 mmol/L (ref 135–146)

## 2022-11-11 ENCOUNTER — Encounter (HOSPITAL_BASED_OUTPATIENT_CLINIC_OR_DEPARTMENT_OTHER): Payer: 59 | Admitting: Orthopaedic Surgery

## 2022-11-11 ENCOUNTER — Telehealth (HOSPITAL_BASED_OUTPATIENT_CLINIC_OR_DEPARTMENT_OTHER): Payer: Self-pay | Admitting: Orthopaedic Surgery

## 2022-11-11 NOTE — Telephone Encounter (Signed)
Left patient message to call back to get po appointment sch

## 2022-12-18 ENCOUNTER — Encounter: Payer: Self-pay | Admitting: Family Medicine

## 2022-12-20 ENCOUNTER — Other Ambulatory Visit: Payer: Self-pay | Admitting: Family Medicine

## 2022-12-20 MED ORDER — VALACYCLOVIR HCL 500 MG PO TABS: 500.0000 mg | ORAL_TABLET | Freq: Every day | ORAL | 3 refills | Status: DC

## 2023-01-05 ENCOUNTER — Other Ambulatory Visit: Payer: Self-pay | Admitting: Family Medicine

## 2023-01-05 DIAGNOSIS — I10 Essential (primary) hypertension: Secondary | ICD-10-CM

## 2023-01-07 NOTE — Telephone Encounter (Signed)
Labs in date.    LOV 10/07/2022  Requested Prescriptions  Pending Prescriptions Disp Refills   nebivolol (BYSTOLIC) 10 MG tablet [Pharmacy Med Name: NEBIVOLOL 10 MG TABLET] 90 tablet 1    Sig: TAKE 1 TABLET BY MOUTH EVERY DAY     Cardiovascular: Beta Blockers 3 Failed - 01/05/2023  8:48 AM      Failed - Last BP in normal range    BP Readings from Last 1 Encounters:  10/07/22 (!) 160/92         Failed - Valid encounter within last 6 months    Recent Outpatient Visits           1 year ago Benign essential HTN   [facility] Donita Brooks, MD   3 years ago Contusion of right knee and lower leg, initial encounter   [facility] Donita Brooks, MD   3 years ago Encounter to establish care with new doctor   [facility] Donita Brooks, MD   8 years ago HSV (herpes simplex virus) infection   [facility], Kenyon Ana, MD   9 years ago HSV (herpes simplex virus) infection   [facility] at [facility], Alycia Rossetti M, PA-C              Passed - Cr in normal range and within 360 days    Creat  Date Value Ref Range Status  10/17/2022 0.98 0.70 - 1.30 mg/dL Final         Passed - AST in normal range and within 360 days    AST  Date Value Ref Range Status  10/07/2022 20 10 - 35 U/L Final         Passed - ALT in normal range and within 360 days    ALT  Date Value Ref Range Status  10/07/2022 25 9 - 46 U/L Final         Passed - Last Heart Rate in normal range    Pulse Readings from Last 1 Encounters:  10/07/22 74

## 2023-04-28 ENCOUNTER — Other Ambulatory Visit: Payer: Self-pay

## 2023-04-28 ENCOUNTER — Encounter: Payer: Self-pay | Admitting: Family Medicine

## 2023-04-28 ENCOUNTER — Telehealth: Payer: Self-pay

## 2023-04-28 NOTE — Telephone Encounter (Signed)
 Good morning Dr Tanya Nones.    I am traveling in [place] for my son's birthday and I forgot my Hydorchlorothiazide 25mg . I was curious if I could have that refilled but I do not see it on my medication chart. I know we changed it out for a different medication at one time but that other medication made me swell so bad that I could not take it so we went back to that. I have been getting regular refills for months and now I have one bottle left but I did leave it at home I just need three or four till I get back home or a refill. Please reach out to me and let me know what we can do. Thank you so much.

## 2023-04-29 ENCOUNTER — Telehealth: Payer: Self-pay | Admitting: Family Medicine

## 2023-04-29 ENCOUNTER — Other Ambulatory Visit: Payer: Self-pay

## 2023-04-29 DIAGNOSIS — I10 Essential (primary) hypertension: Secondary | ICD-10-CM

## 2023-04-29 MED ORDER — HYDROCHLOROTHIAZIDE 25 MG PO TABS
25.0000 mg | ORAL_TABLET | Freq: Every day | ORAL | 1 refills | Status: DC
Start: 2023-04-29 — End: 2023-09-08

## 2023-04-29 NOTE — Telephone Encounter (Signed)
 Prescription Request  04/29/2023  LOV: 10/07/2022  What is the name of the medication or equipment?   hydrochlorothiazide (HYDRODIURIL) 25 MG tablet   Have you contacted your pharmacy to request a refill? Yes   Which pharmacy would you like this sent to?  [id number] [place], [place] - [address] [place] [address] Phone: [phone] Fax: [phone]    Patient notified that their request is being sent to the clinical staff for review and that they should receive a response within 2 business days.   Please advise pharmacist.

## 2023-05-05 ENCOUNTER — Other Ambulatory Visit: Payer: Self-pay | Admitting: Family Medicine

## 2023-05-05 DIAGNOSIS — I10 Essential (primary) hypertension: Secondary | ICD-10-CM

## 2023-05-06 NOTE — Telephone Encounter (Signed)
 Requested Prescriptions  Pending Prescriptions Disp Refills   hydrochlorothiazide (HYDRODIURIL) 25 MG tablet [Pharmacy Med Name: HYDROCHLOROTHIAZIDE 25 MG TAB] 90 tablet 3    Sig: TAKE 1 TABLET (25 MG TOTAL) BY MOUTH DAILY.     Cardiovascular: Diuretics - Thiazide Failed - 05/06/2023 10:34 AM      Failed - Cr in normal range and within 180 days    Creat  Date Value Ref Range Status  10/17/2022 0.98 0.70 - 1.30 mg/dL Final         Failed - K in normal range and within 180 days    Potassium  Date Value Ref Range Status  10/17/2022 3.7 3.5 - 5.3 mmol/L Final         Failed - Na in normal range and within 180 days    Sodium  Date Value Ref Range Status  10/17/2022 135 135 - 146 mmol/L Final         Failed - Last BP in normal range    BP Readings from Last 1 Encounters:  10/07/22 (!) 160/92         Failed - Valid encounter within last 6 months    Recent Outpatient Visits           1 year ago Benign essential HTN   [facility] Donita Brooks, MD   3 years ago Contusion of right knee and lower leg, initial encounter   [facility] Donita Brooks, MD   3 years ago Encounter to establish care with new doctor   [facility] Tanya Nones, Priscille Heidelberg, MD   8 years ago HSV (herpes simplex virus) infection   [facility], Kenyon Ana, MD   9 years ago HSV (herpes simplex virus) infection   [facility] at [facility], Alycia Rossetti M, PA-C               lisinopril (ZESTRIL) 40 MG tablet [Pharmacy Med Name: LISINOPRIL 40 MG TABLET] 90 tablet 0    Sig: TAKE 1 TABLET BY MOUTH EVERY DAY     Cardiovascular:  ACE Inhibitors Failed - 05/06/2023 10:34 AM      Failed - Cr in normal range and within 180 days    Creat  Date Value Ref Range Status  10/17/2022 0.98 0.70 - 1.30 mg/dL Final         Failed - K in normal range and within 180 days    Potassium  Date Value Ref Range Status  10/17/2022 3.7 3.5 - 5.3 mmol/L Final          Failed - Last BP in normal range    BP Readings from Last 1 Encounters:  10/07/22 (!) 160/92         Failed - Valid encounter within last 6 months    Recent Outpatient Visits           1 year ago Benign essential HTN   [facility] Donita Brooks, MD   3 years ago Contusion of right knee and lower leg, initial encounter   [facility] Tanya Nones, Priscille Heidelberg, MD   3 years ago Encounter to establish care with new doctor   [facility] Donita Brooks, MD   8 years ago HSV (herpes simplex virus) infection   [facility], Kenyon Ana, MD   9 years ago HSV (herpes simplex virus) infection   [facility] Care at [facility], Raymon Mutton, [place]  Passed - Patient is not pregnant

## 2023-06-29 ENCOUNTER — Other Ambulatory Visit: Payer: Self-pay | Admitting: Family Medicine

## 2023-06-29 DIAGNOSIS — I10 Essential (primary) hypertension: Secondary | ICD-10-CM

## 2023-07-01 NOTE — Telephone Encounter (Signed)
 Unable to refill per protocol, courtesy refill already given, OV needed.  Requested Prescriptions  Pending Prescriptions Disp Refills   hydrochlorothiazide  (HYDRODIURIL ) 25 MG tablet [Pharmacy Med Name: HYDROCHLOROTHIAZIDE  25 MG TAB] 30 tablet 1    Sig: TAKE 1 TABLET (25 MG TOTAL) BY MOUTH DAILY.     Cardiovascular: Diuretics - Thiazide Failed - 07/01/2023  2:44 PM      Failed - Cr in normal range and within 180 days    Creat  Date Value Ref Range Status  10/17/2022 0.98 0.70 - 1.30 mg/dL Final         Failed - K in normal range and within 180 days    Potassium  Date Value Ref Range Status  10/17/2022 3.7 3.5 - 5.3 mmol/L Final         Failed - Na in normal range and within 180 days    Sodium  Date Value Ref Range Status  10/17/2022 135 135 - 146 mmol/L Final         Failed - Last BP in normal range    BP Readings from Last 1 Encounters:  10/07/22 (!) 160/92         Failed - Valid encounter within last 6 months    Recent Outpatient Visits           8 months ago Fatigue, unspecified type   [facility] Austine Lefort, MD   1 year ago Viral URI   [place] [facility] Jenelle Mis, FNP   1 year ago Psoriasiform eruption   [facility] Cheril Cork, Cisco Crest, MD   1 year ago Herpes zoster with other complication   [place] [facility] Austine Lefort, MD   8 years ago HSV (herpes simplex virus) infection   [facility], MD

## 2023-08-13 ENCOUNTER — Other Ambulatory Visit: Payer: Self-pay

## 2023-08-13 DIAGNOSIS — I10 Essential (primary) hypertension: Secondary | ICD-10-CM

## 2023-08-13 NOTE — Telephone Encounter (Signed)
 Prescription Request  08/13/2023  LOV: 10/07/22  What is the name of the medication or equipment? lisinopril  (ZESTRIL ) 40 MG tablet [[id number]]   Have you contacted your pharmacy to request a refill? Yes   Which pharmacy would you like this sent to?  [id number] [place] [facility], [place] - [address] [place] [address] Phone: [phone] Fax: [phone]    Patient notified that their request is being sent to the clinical staff for review and that they should receive a response within 2 business days.   Please advise at [facility] [phone]

## 2023-08-14 MED ORDER — LISINOPRIL 40 MG PO TABS: 40.0000 mg | ORAL_TABLET | Freq: Every day | ORAL | 0 refills | Status: DC

## 2023-08-14 NOTE — Telephone Encounter (Signed)
 Requested Prescriptions  Pending Prescriptions Disp Refills   lisinopril  (ZESTRIL ) 40 MG tablet 90 tablet 0    Sig: Take 1 tablet (40 mg total) by mouth daily. OFFICE VISIT NEEDED FOR ADDITIONAL REFILLS     Cardiovascular:  ACE Inhibitors Failed - 08/14/2023  8:25 PM      Failed - Cr in normal range and within 180 days    Creat  Date Value Ref Range Status  10/17/2022 0.98 0.70 - 1.30 mg/dL Final         Failed - K in normal range and within 180 days    Potassium  Date Value Ref Range Status  10/17/2022 3.7 3.5 - 5.3 mmol/L Final         Failed - Last BP in normal range    BP Readings from Last 1 Encounters:  10/07/22 (!) 160/92         Failed - Valid encounter within last 6 months    Recent Outpatient Visits           10 months ago Fatigue, unspecified type   [place] [facility] Duanne Butler DASEN, MD   1 year ago Viral URI   [place] [facility] Kayla Jeoffrey RAMAN, FNP   1 year ago Psoriasiform eruption   [place] [facility] Duanne, Butler DASEN, MD   1 year ago Herpes zoster with other complication   [place] [facility] Duanne Butler DASEN, MD   8 years ago HSV (herpes simplex virus) infection   [facility] at [place] Mario Million, MD              Passed - Patient is not pregnant

## 2023-09-06 ENCOUNTER — Encounter: Payer: Self-pay | Admitting: Family Medicine

## 2023-09-08 ENCOUNTER — Other Ambulatory Visit: Payer: Self-pay

## 2023-09-08 DIAGNOSIS — I10 Essential (primary) hypertension: Secondary | ICD-10-CM

## 2023-09-08 MED ORDER — HYDROCHLOROTHIAZIDE 25 MG PO TABS: 25.0000 mg | ORAL_TABLET | Freq: Every day | ORAL | 0 refills | Status: DC

## 2023-10-14 DIAGNOSIS — H524 Presbyopia: Secondary | ICD-10-CM | POA: Diagnosis not present

## 2023-10-14 DIAGNOSIS — Z0100 Encounter for examination of eyes and vision without abnormal findings: Secondary | ICD-10-CM | POA: Diagnosis not present

## 2023-10-18 ENCOUNTER — Other Ambulatory Visit: Payer: Self-pay | Admitting: Family Medicine

## 2023-10-20 NOTE — Telephone Encounter (Signed)
 Requested medications are due for refill today.  yes  Requested medications are on the active medications list.  yes  Last refill. 11/07/2022 #90 3 rf  Future visit scheduled.   no  Notes to clinic.  Pt is more than 3 months overdue for a CPE.    Requested Prescriptions  Pending Prescriptions Disp Refills   amLODipine  (NORVASC ) 10 MG tablet [Pharmacy Med Name: AMLODIPINE  BESYLATE 10 MG TAB] 90 tablet 3    Sig: TAKE 1 TABLET BY MOUTH EVERY DAY     Cardiovascular: Calcium Channel Blockers 2 Failed - 10/20/2023  1:01 PM      Failed - Last BP in normal range    BP Readings from Last 1 Encounters:  10/07/22 (!) 160/92         Failed - Valid encounter within last 6 months    Recent Outpatient Visits           1 year ago Fatigue, unspecified type   [facility] Duanne, Butler DASEN, MD   1 year ago Viral URI   [place] [facility] Kayla Jeoffrey RAMAN, FNP   1 year ago Psoriasiform eruption   [facility] Duanne, Butler DASEN, MD   1 year ago Herpes zoster with other complication   [employer] [facility] Duanne Butler DASEN, MD   8 years ago HSV (herpes simplex virus) infection   [facility], [place], MD              Passed - Last Heart Rate in normal range    Pulse Readings from Last 1 Encounters:  10/07/22 74

## 2023-10-22 ENCOUNTER — Telehealth: Payer: Self-pay

## 2023-10-22 ENCOUNTER — Other Ambulatory Visit: Payer: Self-pay

## 2023-10-22 DIAGNOSIS — I10 Essential (primary) hypertension: Secondary | ICD-10-CM

## 2023-10-22 MED ORDER — AMLODIPINE BESYLATE 10 MG PO TABS: 10.0000 mg | ORAL_TABLET | Freq: Every day | ORAL | 0 refills | Status: DC

## 2023-10-22 NOTE — Telephone Encounter (Signed)
 Copied from CRM #[id number]. Topic: Clinical - Medication Question >> Oct 21, 2023  4:28 PM Taleah C wrote: Reason for CRM: pt called back and stated that he was told he needed an appt to have his blood pressure medication refilled. Pcp only has an opening on Friday and he will be out of town by then. He is asking if the can be overrided into his schedule for tomorrow or Thursday since he needs an appt to have it reflled. Please call and advise with patient.

## 2023-10-24 ENCOUNTER — Ambulatory Visit: Admitting: Family Medicine

## 2023-10-28 ENCOUNTER — Ambulatory Visit: Payer: Self-pay

## 2023-10-28 NOTE — Telephone Encounter (Signed)
 FYI Only or Action Required?: FYI only for provider.  Patient was last seen in primary care on 10/07/2022 by Duanne Butler DASEN, MD.  Called Nurse Triage reporting Hip Pain.  Symptoms began several weeks ago.  Interventions attempted: OTC medications: ibuprofen  and Rest, hydration, or home remedies.  Symptoms are: unchanged.  Triage Disposition: See PCP When Office is Open (Within 3 Days)  Patient/caregiver understands and will follow disposition?: Yes   Copied from CRM [id number]. Topic: Clinical - Red Word Triage >> Oct 28, 2023  2:22 PM Winona R wrote: Really bad hip pain, also need appointment for BP meds. Reason for Disposition  [1] MODERATE pain (e.g., interferes with normal activities, limping) AND [2] present > 3 days  Answer Assessment - Initial Assessment Questions Additional info: Received message from clinic staff to schedule appointment for medication refills. Called in to schedule and also triage hip pain.    1. LOCATION and RADIATION: Where is the pain located? Does the pain spread (shoot) anywhere else?     Left hip 2. QUALITY: What does the pain feel like?  (e.g., sharp, dull, aching, burning)     aching 3. SEVERITY: How bad is the pain? What does it keep you from doing?   (Scale 1-10; or mild, moderate, severe)     Moderate- worsening 4. ONSET: When did the pain start? Does it come and go, or is it there all the time?     Couple weeks  5. WORK OR EXERCISE: Has there been any recent work or exercise that involved this part of the body?      no 6. CAUSE: What do you think is causing the hip pain?      unsure 7. AGGRAVATING FACTORS: What makes the hip pain worse? (e.g., walking, climbing stairs, running)     walking 8. OTHER SYMPTOMS: Do you have any other symptoms? (e.g., back pain, pain shooting down leg,  fever, rash)     Denies  Protocols used: Hip Pain-A-AH

## 2023-10-30 ENCOUNTER — Ambulatory Visit: Admitting: Family Medicine

## 2023-10-30 ENCOUNTER — Encounter: Payer: Self-pay | Admitting: Family Medicine

## 2023-10-30 ENCOUNTER — Ambulatory Visit
Admission: RE | Admit: 2023-10-30 | Discharge: 2023-10-30 | Disposition: A | Discharge status: Home / Self Care | Source: Ambulatory Visit | Attending: Family Medicine | Admitting: Family Medicine

## 2023-10-30 VITALS — BP 120/76 | HR 60 | Temp 97.6°F | Ht 70.0 in | Wt 210.0 lb

## 2023-10-30 DIAGNOSIS — M1612 Unilateral primary osteoarthritis, left hip: Secondary | ICD-10-CM | POA: Diagnosis not present

## 2023-10-30 DIAGNOSIS — M25552 Pain in left hip: Secondary | ICD-10-CM

## 2023-10-30 DIAGNOSIS — I10 Essential (primary) hypertension: Secondary | ICD-10-CM

## 2023-10-30 NOTE — Progress Notes (Signed)
 Subjective:    Patient ID: Timothy Calderon, male    DOB: 04-11-1965, 58 y.o.   MRN: [id number]  Hip Pain   Patient's blood pressure is well-controlled today at 120/76.  However he comes in complaining of anterior left hip pain.  The pain has been there for approximately 3 weeks.  Patient's forward flexion is limited.  It hurts to flex to 90 degrees.  He has severe sharp point pain with internal and external rotation.  He also complains of some numbness and tingling in both arms.  He has a history of cervical degenerative disc disease however he denies any weakness in his arms.  He does not want to pursue further workup for this at the present time Past Medical History:  Diagnosis Date   DDD (degenerative disc disease), lumbar    L4-5   HSV (herpes simplex virus) infection    Hypertension    Past Surgical History:  Procedure Laterality Date   DISTAL BICEPS TENDON REPAIR Left 07/11/2022   Procedure: LEFT ENDOSCOPIC ELBOW DISTAL BICEPS REPAIR;  Surgeon: Genelle Standing, MD;  Location: [facility];  Service: Orthopedics;  Laterality: Left;   NECK SURGERY     2 ruptured disc, 2019, [facility]-    VASECTOMY     Current Outpatient Medications on File Prior to Visit  Medication Sig Dispense Refill   aspirin EC 81 MG tablet Take 81 mg by mouth daily.     hydrochlorothiazide  (HYDRODIURIL ) 25 MG tablet Take 1 tablet (25 mg total) by mouth daily. 90 tablet 0   lisinopril  (ZESTRIL ) 40 MG tablet Take 1 tablet (40 mg total) by mouth daily. OFFICE VISIT NEEDED FOR ADDITIONAL REFILLS 90 tablet 0   nebivolol  (BYSTOLIC ) 10 MG tablet TAKE 1 TABLET BY MOUTH EVERY DAY 90 tablet 1   triamcinolone  cream (KENALOG ) 0.1 % Apply 1 Application topically 2 (two) times daily. 80 g 2   valACYclovir  (VALTREX ) 500 MG tablet Take 1 tablet (500 mg total) by mouth daily. 90 tablet 3   amLODipine  (NORVASC ) 10 MG tablet Take 1 tablet (10 mg total) by mouth daily. (Patient not taking: Reported on 10/30/2023) 30 tablet  0   LORazepam  (ATIVAN ) 1 MG tablet Take 1 tablet (1 mg total) by mouth every 8 (eight) hours. (Patient not taking: Reported on 10/07/2022) 1 tablet 0   oxyCODONE  (ROXICODONE ) 5 MG immediate release tablet Take 1 tablet (5 mg total) by mouth every 4 (four) hours as needed for severe pain or breakthrough pain. (Patient not taking: Reported on 10/07/2022) 5 tablet 0   predniSONE  (DELTASONE ) 20 MG tablet 3 tabs poqday 1-2, 2 tabs poqday 3-4, 1 tab poqday 5-6 (Patient not taking: Reported on 10/07/2022) 12 tablet 0   No current facility-administered medications on file prior to visit.   Allergies  Allergen Reactions   Elemental Sulfur Hives   Penicillins Hives    Was told when he was a kid he was allergic    Social History   Socioeconomic History   Marital status: Single    Spouse name: Not on file   Number of children: Not on file   Years of education: Not on file   Highest education level: Not on file  Occupational History   Not on file  Tobacco Use   Smoking status: Some Days    Current packs/day: 0.10    Average packs/day: 0.1 packs/day for 24.0 years (2.4 ttl pk-yrs)    Types: Cigarettes   Smokeless tobacco: Never  Substance and  Sexual Activity   Alcohol use: Yes    Alcohol/week: 2.0 standard drinks of alcohol    Types: 2 Cans of beer per week   Drug use: No   Sexual activity: Never  Other Topics Concern   Not on file  Social History Narrative   Not on file   Social Drivers of Health   Financial Resource Strain: Not on file  Food Insecurity: Not on file  Transportation Needs: Not on file  Physical Activity: Not on file  Stress: Not on file  Social Connections: Not on file  Intimate Partner Violence: Not on file   Family History  Problem Relation Age of Onset   Cancer Father        Bladder   Heart disease Father    Cancer Mother        pancreatic   Heart disease Maternal Uncle    Heart disease Paternal Uncle       Review of Systems  All other systems  reviewed and are negative.      Objective:   Physical Exam Vitals reviewed.  Constitutional:      General: He is not in acute distress.    Appearance: He is normal weight. He is not ill-appearing, toxic-appearing or diaphoretic.  HENT:     Head: Normocephalic and atraumatic.     Right Ear: External ear normal. There is no impacted cerumen.     Left Ear: External ear normal. There is no impacted cerumen.     Nose: Nose normal. No congestion or rhinorrhea.     Mouth/Throat:     Mouth: Mucous membranes are moist.     Pharynx: No oropharyngeal exudate or posterior oropharyngeal erythema.  Eyes:     General: No scleral icterus.       Right eye: No discharge.        Left eye: No discharge.     Extraocular Movements: Extraocular movements intact.     Conjunctiva/sclera: Conjunctivae normal.     Pupils: Pupils are equal, round, and reactive to light.  Neck:     Vascular: No carotid bruit.  Cardiovascular:     Rate and Rhythm: Normal rate and regular rhythm.     Pulses: Normal pulses.     Heart sounds: Normal heart sounds. No murmur heard.    No friction rub.  Pulmonary:     Effort: Pulmonary effort is normal. No respiratory distress.     Breath sounds: Normal breath sounds. No stridor. No wheezing, rhonchi or rales.  Chest:     Chest wall: No tenderness.  Abdominal:     General: Abdomen is flat. There is no distension.     Palpations: Abdomen is soft. There is no mass.     Tenderness: There is no abdominal tenderness. There is no right CVA tenderness, left CVA tenderness, guarding or rebound.     Hernia: No hernia is present.  Musculoskeletal:     Cervical back: Normal range of motion and neck supple. No rigidity or tenderness.     Left hip: Tenderness and bony tenderness present. Decreased range of motion.     Right lower leg: No edema.     Left lower leg: No edema.       Legs:  Lymphadenopathy:     Cervical: No cervical adenopathy.  Skin:    General: Skin is warm.      Coloration: Skin is not jaundiced or pale.     Findings: No bruising, erythema, lesion or rash.  Neurological:  General: No focal deficit present.     Mental Status: He is alert and oriented to person, place, and time. Mental status is at baseline.     Cranial Nerves: No cranial nerve deficit.     Sensory: No sensory deficit.     Motor: No weakness.     Coordination: Coordination normal.     Gait: Gait normal.     Deep Tendon Reflexes: Reflexes normal.  Psychiatric:        Mood and Affect: Mood normal.        Behavior: Behavior normal.        Thought Content: Thought content normal.        Judgment: Judgment normal.    Ruddy appearance       Assessment & Plan:  Left hip pain - Plan: DG Hip Unilat W OR W/O Pelvis 2-3 Views Left  Benign essential HTN - Plan: CBC with Differential/Platelet, Comprehensive metabolic panel with GFR, Lipid panel Blood pressure is well-controlled.  Check CBC, CMP, and lipid panel.  Obtain x-ray of the left hip.  If x-ray is normal, consult orthopedics for possible MRI of the left hip to evaluate for cartilage defect causing pain.  Patient declines further workup for the numbness and tingling in his hands.  Would recommend nerve conduction studies if he changes his mind

## 2023-10-31 ENCOUNTER — Ambulatory Visit: Payer: Self-pay | Admitting: Family Medicine

## 2023-10-31 DIAGNOSIS — M25552 Pain in left hip: Secondary | ICD-10-CM

## 2023-10-31 LAB — COMPREHENSIVE METABOLIC PANEL WITH GFR
AG Ratio: 2.2 (calc) (ref 1.0–2.5)
ALT: 35 U/L (ref 9–46)
AST: 25 U/L (ref 10–35)
Albumin: 4.6 g/dL (ref 3.6–5.1)
Alkaline phosphatase (APISO): 103 U/L (ref 35–144)
BUN: 15 mg/dL (ref 7–25)
CO2: 32 mmol/L (ref 20–32)
Calcium: 10 mg/dL (ref 8.6–10.3)
Chloride: 94 mmol/L — ABNORMAL LOW (ref 98–110)
Creat: 0.98 mg/dL (ref 0.70–1.30)
Globulin: 2.1 g/dL (ref 1.9–3.7)
Glucose, Bld: 81 mg/dL (ref 65–99)
Potassium: 4.5 mmol/L (ref 3.5–5.3)
Sodium: 131 mmol/L — ABNORMAL LOW (ref 135–146)
Total Bilirubin: 1 mg/dL (ref 0.2–1.2)
Total Protein: 6.7 g/dL (ref 6.1–8.1)
eGFR: 91 mL/min/1.73m2 (ref >=60)

## 2023-10-31 LAB — CBC WITH DIFFERENTIAL/PLATELET
Absolute Lymphocytes: 1264 {cells}/uL (ref 850–3900)
Absolute Monocytes: 436 {cells}/uL (ref 200–950)
Basophils Absolute: 28 {cells}/uL (ref 0–200)
Basophils Relative: 0.7 %
Eosinophils Absolute: 60 {cells}/uL (ref 15–500)
Eosinophils Relative: 1.5 %
HCT: 44.7 % (ref 38.5–50.0)
Hemoglobin: 15.3 g/dL (ref 13.2–17.1)
MCH: 33 pg (ref 27.0–33.0)
MCHC: 34.2 g/dL (ref 32.0–36.0)
MCV: 96.3 fL (ref 80.0–100.0)
MPV: 11 fL (ref 7.5–12.5)
Monocytes Relative: 10.9 %
Neutro Abs: 2212 {cells}/uL (ref 1500–7800)
Neutrophils Relative %: 55.3 %
Platelets: 177 Thousand/uL (ref 140–400)
RBC: 4.64 Million/uL (ref 4.20–5.80)
RDW: 11.7 % (ref 11.0–15.0)
Total Lymphocyte: 31.6 %
WBC: 4 Thousand/uL (ref 3.8–10.8)

## 2023-10-31 LAB — LIPID PANEL
Cholesterol: 177 mg/dL (ref <200)
HDL: 52 mg/dL (ref >=40)
LDL Cholesterol (Calc): 102 mg/dL — ABNORMAL HIGH
Non-HDL Cholesterol (Calc): 125 mg/dL (ref <130)
Total CHOL/HDL Ratio: 3.4 (calc) (ref <5.0)
Triglycerides: 132 mg/dL (ref <150)

## 2023-10-31 MED ORDER — MELOXICAM 15 MG PO TABS: 15.0000 mg | ORAL_TABLET | Freq: Every day | ORAL | 0 refills | Status: DC

## 2023-11-14 ENCOUNTER — Other Ambulatory Visit: Payer: Self-pay | Admitting: Family Medicine

## 2023-11-14 DIAGNOSIS — I10 Essential (primary) hypertension: Secondary | ICD-10-CM

## 2023-11-14 NOTE — Telephone Encounter (Signed)
 Requested Prescriptions  Pending Prescriptions Disp Refills   amLODipine  (NORVASC ) 10 MG tablet [Pharmacy Med Name: AMLODIPINE  BESYLATE 10 MG TAB] 90 tablet 1    Sig: TAKE 1 TABLET BY MOUTH EVERY DAY     Cardiovascular: Calcium Channel Blockers 2 Failed - 11/14/2023  5:55 PM      Failed - Valid encounter within last 6 months    Recent Outpatient Visits           2 weeks ago Left hip pain   [place] [facility] Duanne Butler DASEN, MD   1 year ago Fatigue, unspecified type   [place] [facility] Duanne Butler DASEN, MD   1 year ago Viral URI   [place] [facility] Kayla Jeoffrey RAMAN, FNP   1 year ago Psoriasiform eruption   [facility] [facility] Duanne, Butler DASEN, MD   1 year ago Herpes zoster with other complication   [place] [facility] Pickard, Butler DASEN, MD              Passed - Last BP in normal range    BP Readings from Last 1 Encounters:  10/30/23 120/76         Passed - Last Heart Rate in normal range    Pulse Readings from Last 1 Encounters:  10/30/23 60

## 2023-11-18 ENCOUNTER — Other Ambulatory Visit: Payer: Self-pay | Admitting: Family Medicine

## 2023-11-27 ENCOUNTER — Other Ambulatory Visit: Payer: Self-pay | Admitting: Family Medicine

## 2023-11-27 DIAGNOSIS — I10 Essential (primary) hypertension: Secondary | ICD-10-CM

## 2023-12-02 ENCOUNTER — Other Ambulatory Visit: Payer: Self-pay | Admitting: Family Medicine

## 2023-12-02 ENCOUNTER — Encounter: Payer: Self-pay | Admitting: Family Medicine

## 2023-12-02 DIAGNOSIS — M25552 Pain in left hip: Secondary | ICD-10-CM

## 2023-12-02 MED ORDER — MELOXICAM 15 MG PO TABS: 15.0000 mg | ORAL_TABLET | Freq: Every day | ORAL | 5 refills | Status: AC

## 2023-12-30 ENCOUNTER — Other Ambulatory Visit: Payer: Self-pay | Admitting: Family Medicine

## 2024-01-01 NOTE — Telephone Encounter (Signed)
 Unable to refill per protocol, appointment needed.   Requested Prescriptions  Pending Prescriptions Disp Refills   valACYclovir  (VALTREX ) 500 MG tablet [Pharmacy Med Name: VALACYCLOVIR  HCL 500 MG TABLET] 30 tablet 11    Sig: TAKE 1 TABLET (500 MG TOTAL) BY MOUTH DAILY.     Antimicrobials:  Antiviral Agents - Anti-Herpetic Failed - 01/01/2024  1:49 PM      Failed - Valid encounter within last 12 months    Recent Outpatient Visits           2 months ago Left hip pain   [place] [facility] Duanne, Butler DASEN, MD   1 year ago Fatigue, unspecified type   [place] [facility] Duanne Butler DASEN, MD   1 year ago Viral URI   [place] [facility] Kayla Jeoffrey RAMAN, FNP   2 years ago Psoriasiform eruption   [place] [facility] Duanne Butler DASEN, MD   2 years ago Herpes zoster with other complication   [place] [facility] Pickard, Butler DASEN, MD

## 2024-01-05 ENCOUNTER — Encounter: Payer: Self-pay | Admitting: Family Medicine

## 2024-01-05 ENCOUNTER — Other Ambulatory Visit: Payer: Self-pay

## 2024-01-05 DIAGNOSIS — B009 Herpesviral infection, unspecified: Secondary | ICD-10-CM

## 2024-01-05 MED ORDER — VALACYCLOVIR HCL 500 MG PO TABS: 500.0000 mg | ORAL_TABLET | Freq: Every day | ORAL | 3 refills | Status: DC

## 2024-01-05 MED ORDER — VALACYCLOVIR HCL 500 MG PO TABS: 500.0000 mg | ORAL_TABLET | Freq: Every day | ORAL | 1 refills | Status: AC

## 2024-02-18 ENCOUNTER — Other Ambulatory Visit: Payer: Self-pay

## 2024-02-18 ENCOUNTER — Telehealth: Payer: Self-pay | Admitting: Family Medicine

## 2024-02-18 DIAGNOSIS — I10 Essential (primary) hypertension: Secondary | ICD-10-CM

## 2024-02-18 MED ORDER — HYDROCHLOROTHIAZIDE 25 MG PO TABS: 25.0000 mg | ORAL_TABLET | Freq: Every day | ORAL | 2 refills | Status: AC

## 2024-02-18 MED ORDER — LISINOPRIL 40 MG PO TABS: 40.0000 mg | ORAL_TABLET | Freq: Every day | ORAL | 2 refills | Status: AC

## 2024-02-18 NOTE — Telephone Encounter (Signed)
 Prescription Request  02/18/2024  LOV: 10/30/2023  What is the name of the medication or equipment?   lisinopril  (ZESTRIL ) 40 MG tablet   hydrochlorothiazide  (HYDRODIURIL ) 25 MG tablet   Have you contacted your pharmacy to request a refill? Yes   Which pharmacy would you like this sent to?  [id number] [address], [place] - [address] [place] [address] Phone: [phone] Fax: [phone]    Patient notified that their request is being sent to the clinical staff for review and that they should receive a response within 2 business days.   Please advise pharmacist.

## 2024-02-18 NOTE — Telephone Encounter (Signed)
 Sent in medication
